# Patient Record
Sex: Male | Born: 1970 | Race: White | Hispanic: No | Smoking: Never smoker
Health system: Southern US, Community
[De-identification: ages and names within clinical notes are randomized; demographics above are authoritative.]

## PROBLEM LIST (undated history)

## (undated) DIAGNOSIS — Z794 Long term (current) use of insulin: Secondary | ICD-10-CM

## (undated) DIAGNOSIS — E119 Type 2 diabetes mellitus without complications: Secondary | ICD-10-CM

## (undated) DIAGNOSIS — I330 Acute and subacute infective endocarditis: Secondary | ICD-10-CM

## (undated) DIAGNOSIS — A491 Streptococcal infection, unspecified site: Secondary | ICD-10-CM

## (undated) DIAGNOSIS — M01X0 Direct infection of unspecified joint in infectious and parasitic diseases classified elsewhere: Secondary | ICD-10-CM

## (undated) DIAGNOSIS — Z87442 Personal history of urinary calculi: Secondary | ICD-10-CM

## (undated) DIAGNOSIS — IMO0001 Reserved for inherently not codable concepts without codable children: Secondary | ICD-10-CM

## (undated) DIAGNOSIS — A429 Actinomycosis, unspecified: Secondary | ICD-10-CM

## (undated) DIAGNOSIS — T79A0XA Compartment syndrome, unspecified, initial encounter: Secondary | ICD-10-CM

## (undated) DIAGNOSIS — M009 Pyogenic arthritis, unspecified: Secondary | ICD-10-CM

## (undated) DIAGNOSIS — B49 Unspecified mycosis: Secondary | ICD-10-CM

## (undated) DIAGNOSIS — F1990 Other psychoactive substance use, unspecified, uncomplicated: Secondary | ICD-10-CM

## (undated) DIAGNOSIS — I358 Other nonrheumatic aortic valve disorders: Secondary | ICD-10-CM

## (undated) DIAGNOSIS — G8929 Other chronic pain: Secondary | ICD-10-CM

## (undated) HISTORY — PX: CHOLECYSTECTOMY: SHX55

## (undated) HISTORY — PX: DECOMPRESSION FASCIOTOMY LEG: SUR403

## (undated) HISTORY — PX: SKIN GRAFT: SHX250

## (undated) HISTORY — PX: SHOULDER SURGERY: SHX246

---

## 2013-03-26 DIAGNOSIS — M51379 Other intervertebral disc degeneration, lumbosacral region without mention of lumbar back pain or lower extremity pain: Secondary | ICD-10-CM | POA: Insufficient documentation

## 2013-04-18 DIAGNOSIS — E1165 Type 2 diabetes mellitus with hyperglycemia: Secondary | ICD-10-CM | POA: Insufficient documentation

## 2013-05-23 DIAGNOSIS — F411 Generalized anxiety disorder: Secondary | ICD-10-CM | POA: Insufficient documentation

## 2015-10-25 DIAGNOSIS — G894 Chronic pain syndrome: Secondary | ICD-10-CM | POA: Diagnosis not present

## 2015-10-25 DIAGNOSIS — F419 Anxiety disorder, unspecified: Secondary | ICD-10-CM | POA: Diagnosis not present

## 2015-10-25 DIAGNOSIS — I10 Essential (primary) hypertension: Secondary | ICD-10-CM | POA: Diagnosis not present

## 2015-10-25 DIAGNOSIS — M129 Arthropathy, unspecified: Secondary | ICD-10-CM | POA: Diagnosis not present

## 2015-10-25 DIAGNOSIS — F1120 Opioid dependence, uncomplicated: Secondary | ICD-10-CM | POA: Diagnosis not present

## 2015-11-28 DIAGNOSIS — G894 Chronic pain syndrome: Secondary | ICD-10-CM | POA: Diagnosis not present

## 2015-11-28 DIAGNOSIS — M1990 Unspecified osteoarthritis, unspecified site: Secondary | ICD-10-CM | POA: Diagnosis not present

## 2015-11-28 DIAGNOSIS — E119 Type 2 diabetes mellitus without complications: Secondary | ICD-10-CM | POA: Diagnosis not present

## 2015-11-28 DIAGNOSIS — I10 Essential (primary) hypertension: Secondary | ICD-10-CM | POA: Diagnosis not present

## 2015-12-13 DIAGNOSIS — M7989 Other specified soft tissue disorders: Secondary | ICD-10-CM | POA: Diagnosis not present

## 2015-12-13 DIAGNOSIS — Y939 Activity, unspecified: Secondary | ICD-10-CM | POA: Diagnosis not present

## 2015-12-13 DIAGNOSIS — W19XXXA Unspecified fall, initial encounter: Secondary | ICD-10-CM | POA: Diagnosis not present

## 2015-12-13 DIAGNOSIS — S5012XA Contusion of left forearm, initial encounter: Secondary | ICD-10-CM | POA: Diagnosis not present

## 2015-12-13 DIAGNOSIS — M25522 Pain in left elbow: Secondary | ICD-10-CM | POA: Diagnosis not present

## 2015-12-14 DIAGNOSIS — M25522 Pain in left elbow: Secondary | ICD-10-CM | POA: Diagnosis not present

## 2015-12-14 DIAGNOSIS — B954 Other streptococcus as the cause of diseases classified elsewhere: Secondary | ICD-10-CM | POA: Diagnosis not present

## 2015-12-14 DIAGNOSIS — I10 Essential (primary) hypertension: Secondary | ICD-10-CM | POA: Diagnosis not present

## 2015-12-14 DIAGNOSIS — E1165 Type 2 diabetes mellitus with hyperglycemia: Secondary | ICD-10-CM | POA: Diagnosis not present

## 2015-12-14 DIAGNOSIS — M1990 Unspecified osteoarthritis, unspecified site: Secondary | ICD-10-CM | POA: Diagnosis not present

## 2015-12-14 DIAGNOSIS — F418 Other specified anxiety disorders: Secondary | ICD-10-CM | POA: Diagnosis not present

## 2015-12-14 DIAGNOSIS — F319 Bipolar disorder, unspecified: Secondary | ICD-10-CM | POA: Diagnosis not present

## 2015-12-14 DIAGNOSIS — F1910 Other psychoactive substance abuse, uncomplicated: Secondary | ICD-10-CM | POA: Diagnosis not present

## 2015-12-14 DIAGNOSIS — Z794 Long term (current) use of insulin: Secondary | ICD-10-CM | POA: Diagnosis not present

## 2015-12-14 DIAGNOSIS — L03114 Cellulitis of left upper limb: Secondary | ICD-10-CM | POA: Diagnosis not present

## 2015-12-14 DIAGNOSIS — M069 Rheumatoid arthritis, unspecified: Secondary | ICD-10-CM | POA: Diagnosis not present

## 2015-12-14 DIAGNOSIS — Z79899 Other long term (current) drug therapy: Secondary | ICD-10-CM | POA: Diagnosis not present

## 2015-12-14 DIAGNOSIS — Z885 Allergy status to narcotic agent status: Secondary | ICD-10-CM | POA: Diagnosis not present

## 2015-12-14 DIAGNOSIS — E118 Type 2 diabetes mellitus with unspecified complications: Secondary | ICD-10-CM | POA: Diagnosis not present

## 2015-12-14 DIAGNOSIS — L02414 Cutaneous abscess of left upper limb: Secondary | ICD-10-CM | POA: Diagnosis not present

## 2015-12-15 DIAGNOSIS — L03114 Cellulitis of left upper limb: Secondary | ICD-10-CM | POA: Diagnosis not present

## 2015-12-15 DIAGNOSIS — E1165 Type 2 diabetes mellitus with hyperglycemia: Secondary | ICD-10-CM | POA: Diagnosis not present

## 2015-12-15 DIAGNOSIS — I10 Essential (primary) hypertension: Secondary | ICD-10-CM | POA: Diagnosis not present

## 2015-12-16 DIAGNOSIS — Z794 Long term (current) use of insulin: Secondary | ICD-10-CM | POA: Diagnosis not present

## 2015-12-16 DIAGNOSIS — E1165 Type 2 diabetes mellitus with hyperglycemia: Secondary | ICD-10-CM | POA: Diagnosis not present

## 2015-12-16 DIAGNOSIS — I10 Essential (primary) hypertension: Secondary | ICD-10-CM | POA: Diagnosis not present

## 2015-12-16 DIAGNOSIS — E119 Type 2 diabetes mellitus without complications: Secondary | ICD-10-CM | POA: Diagnosis not present

## 2015-12-16 DIAGNOSIS — L03114 Cellulitis of left upper limb: Secondary | ICD-10-CM | POA: Diagnosis not present

## 2015-12-16 DIAGNOSIS — L02414 Cutaneous abscess of left upper limb: Secondary | ICD-10-CM | POA: Diagnosis not present

## 2015-12-17 DIAGNOSIS — S59902A Unspecified injury of left elbow, initial encounter: Secondary | ICD-10-CM | POA: Diagnosis not present

## 2015-12-17 DIAGNOSIS — M25562 Pain in left knee: Secondary | ICD-10-CM | POA: Diagnosis not present

## 2015-12-24 DIAGNOSIS — Z87828 Personal history of other (healed) physical injury and trauma: Secondary | ICD-10-CM | POA: Diagnosis not present

## 2015-12-24 DIAGNOSIS — Z9889 Other specified postprocedural states: Secondary | ICD-10-CM | POA: Diagnosis not present

## 2015-12-24 DIAGNOSIS — I10 Essential (primary) hypertension: Secondary | ICD-10-CM | POA: Diagnosis not present

## 2015-12-24 DIAGNOSIS — E119 Type 2 diabetes mellitus without complications: Secondary | ICD-10-CM | POA: Diagnosis not present

## 2015-12-24 DIAGNOSIS — Z4800 Encounter for change or removal of nonsurgical wound dressing: Secondary | ICD-10-CM | POA: Diagnosis not present

## 2015-12-24 DIAGNOSIS — Z79899 Other long term (current) drug therapy: Secondary | ICD-10-CM | POA: Diagnosis not present

## 2015-12-24 DIAGNOSIS — M25522 Pain in left elbow: Secondary | ICD-10-CM | POA: Diagnosis not present

## 2015-12-24 DIAGNOSIS — G8929 Other chronic pain: Secondary | ICD-10-CM | POA: Diagnosis not present

## 2015-12-26 DIAGNOSIS — F419 Anxiety disorder, unspecified: Secondary | ICD-10-CM | POA: Diagnosis not present

## 2015-12-26 DIAGNOSIS — E119 Type 2 diabetes mellitus without complications: Secondary | ICD-10-CM | POA: Diagnosis not present

## 2015-12-26 DIAGNOSIS — F5221 Male erectile disorder: Secondary | ICD-10-CM | POA: Diagnosis not present

## 2015-12-26 DIAGNOSIS — M1380 Other specified arthritis, unspecified site: Secondary | ICD-10-CM | POA: Diagnosis not present

## 2015-12-26 DIAGNOSIS — I11 Hypertensive heart disease: Secondary | ICD-10-CM | POA: Diagnosis not present

## 2015-12-26 DIAGNOSIS — I10 Essential (primary) hypertension: Secondary | ICD-10-CM | POA: Diagnosis not present

## 2015-12-26 DIAGNOSIS — G894 Chronic pain syndrome: Secondary | ICD-10-CM | POA: Diagnosis not present

## 2016-01-24 DIAGNOSIS — F419 Anxiety disorder, unspecified: Secondary | ICD-10-CM | POA: Diagnosis not present

## 2016-01-24 DIAGNOSIS — G629 Polyneuropathy, unspecified: Secondary | ICD-10-CM | POA: Diagnosis not present

## 2016-01-24 DIAGNOSIS — G894 Chronic pain syndrome: Secondary | ICD-10-CM | POA: Diagnosis not present

## 2016-02-25 DIAGNOSIS — G894 Chronic pain syndrome: Secondary | ICD-10-CM | POA: Diagnosis not present

## 2016-03-06 ENCOUNTER — Other Ambulatory Visit: Payer: Self-pay

## 2016-03-06 NOTE — Patient Outreach (Signed)
[facility]) Care Management  03/06/2016  Derek Mitchell 05-24-1971 [id number]  Telephone call to patient regarding health team advantage high risk referral. Unable to reach patient.  HIPAA compliant voice mail message left with call back phone number.    PLAN:  RNCM will attempt 2nd telephone call to patient within 1 week.  George Ina RN,BSN,CCM [facility] Telephonic  [phone]

## 2016-03-10 ENCOUNTER — Ambulatory Visit: Payer: Self-pay

## 2016-03-13 ENCOUNTER — Other Ambulatory Visit: Payer: Self-pay

## 2016-03-13 DIAGNOSIS — E0821 Diabetes mellitus due to underlying condition with diabetic nephropathy: Secondary | ICD-10-CM

## 2016-03-13 NOTE — Patient Outreach (Signed)
[facility]) Care Management  03/13/2016  Derek Mitchell September 26, 1971 [id number]   SUBJECTIVE:  Telephone call to patient regarding health team advantage high risk referral.  HIPAA verified with patient. Discussed and offered [facility] care management services to patient. Patient verbally agreed to telephone follow up only.  DIABETES: Patient states he is having difficulty with his blood sugars. Patient states his last visit with his primary MD was 02/25/16.  Patient states he will be following up with his primary MD in June 2017. Patient states he tried to make a follow up appointment with his primary MD at his last visit. States the office was busy and would call him back to schedule. Patient states he still does not have a follow up appointment for June.   Patient states his last A1c was 11.6. Patient states his blood sugar has reached a level that it would not register on the glucometer and his lowest blood sugar was 280.   Patient states his blood sugars run in the 600's. Patient states he passes out or becomes lethargic when his blood sugar is around 700.  Patient states he is on insulin either levimir or lantas. Patient states he does not take his insulin all of the time. Patient states "he has a problem buying medicine that does not work."  Patient states he checks his blood sugar, "maybe every other day." patient states he does not have a glucometer. Patient states he uses his mothers glucometer.Patient states his doctor talked with him about seeing an endocrinologist. Patient states he has severe neuropathy in his legs. Patient states he has had 13 surgeries on his right leg. States his leg was almost amputated but the doctor was able to save it. Patient states his eyes are already being affected because of his diabetes. t. Patient states he does have some dental issues because of the diabetes as well.   Patient states he has been in the emergency room in the past due to irregular heart  beat.  Patient states he does not take any medication for this.  Patient states he was prescribed medication for his blood pressure.  States he does not take all of the time because his blood pressures are normally good.  Patient states he does have a blood pressure monitor at home and he checks his blood pressure periodically.  RNCM offered to mail patient information regarding dental resources.  Patient verbally agreed to receive information.   ASSESSMENT: Patient poorly controlling diabetes.  Most recent A1c per patient is 11.6. Does not have his own glucometer, uses his mothers.  Per patient he is not checking his blood sugars regularly or taking his diabetic medication regularly.   Patient would benefit from diabetes disease management.   PLAN:  RNCM will refer patient to health coach.   George Ina RN,BSN,CCM [facility] Telephonic  [phone]

## 2016-03-19 DIAGNOSIS — R52 Pain, unspecified: Secondary | ICD-10-CM | POA: Diagnosis not present

## 2016-03-19 DIAGNOSIS — F410 Panic disorder [episodic paroxysmal anxiety] without agoraphobia: Secondary | ICD-10-CM | POA: Diagnosis not present

## 2016-03-24 ENCOUNTER — Other Ambulatory Visit: Payer: Self-pay | Admitting: *Deleted

## 2016-03-24 NOTE — Patient Outreach (Addendum)
[facility]) Care Management  03/24/2016  Derek Mitchell February 09, 1971 [id number]   RN Health Coach telephone call to patient for initial assessment.  Patient referred from [place] [facility] CM. Per patient he does have a PCP Dr Lerry Liner. Patient is on his way out the door and requested a call back.  Plan: RN will  Call back with 14 days  Gean Maidens BSN RN [facility] Care Management [phone]

## 2016-03-26 DIAGNOSIS — F1110 Opioid abuse, uncomplicated: Secondary | ICD-10-CM | POA: Diagnosis not present

## 2016-03-26 DIAGNOSIS — L03114 Cellulitis of left upper limb: Secondary | ICD-10-CM | POA: Diagnosis not present

## 2016-03-26 DIAGNOSIS — I10 Essential (primary) hypertension: Secondary | ICD-10-CM | POA: Diagnosis not present

## 2016-03-26 DIAGNOSIS — Z794 Long term (current) use of insulin: Secondary | ICD-10-CM | POA: Diagnosis not present

## 2016-03-26 DIAGNOSIS — L02414 Cutaneous abscess of left upper limb: Secondary | ICD-10-CM | POA: Diagnosis not present

## 2016-03-26 DIAGNOSIS — G8929 Other chronic pain: Secondary | ICD-10-CM | POA: Diagnosis not present

## 2016-03-26 DIAGNOSIS — L03113 Cellulitis of right upper limb: Secondary | ICD-10-CM | POA: Diagnosis not present

## 2016-03-26 DIAGNOSIS — F319 Bipolar disorder, unspecified: Secondary | ICD-10-CM | POA: Diagnosis not present

## 2016-03-26 DIAGNOSIS — E119 Type 2 diabetes mellitus without complications: Secondary | ICD-10-CM | POA: Diagnosis not present

## 2016-03-26 DIAGNOSIS — F1410 Cocaine abuse, uncomplicated: Secondary | ICD-10-CM | POA: Diagnosis not present

## 2016-03-26 DIAGNOSIS — Z79899 Other long term (current) drug therapy: Secondary | ICD-10-CM | POA: Diagnosis not present

## 2016-03-26 DIAGNOSIS — F1210 Cannabis abuse, uncomplicated: Secondary | ICD-10-CM | POA: Diagnosis not present

## 2016-03-31 ENCOUNTER — Other Ambulatory Visit: Payer: Self-pay | Admitting: *Deleted

## 2016-03-31 DIAGNOSIS — G894 Chronic pain syndrome: Secondary | ICD-10-CM | POA: Diagnosis not present
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
## 2016-04-01 ENCOUNTER — Encounter: Payer: Self-pay | Admitting: *Deleted

## 2016-04-05 DIAGNOSIS — L02413 Cutaneous abscess of right upper limb: Secondary | ICD-10-CM | POA: Diagnosis not present

## 2016-04-29 ENCOUNTER — Other Ambulatory Visit: Payer: Self-pay | Admitting: *Deleted

## 2016-04-29 NOTE — Patient Outreach (Signed)
[facility]) Care Management  04/29/2016  Derek Mitchell [birth year] [id number]   RN Health Coach  attempted  #1 Follow up outreach call to patient.  Patient stated he was at the Dr office now. RN suggested you may be able to get an A1C done. Patient stated maybe I can't talk right now. Plan: RN will call patient again within 14 days.  Gean Maidens BSN RN [facility] Care Management [phone]

## 2016-05-05 ENCOUNTER — Other Ambulatory Visit: Payer: Self-pay | Admitting: *Deleted

## 2016-05-08 NOTE — Patient Outreach (Signed)
[facility]) Care Management  05/08/2016  Derek Mitchell [birth year] [id number]  RN Health Coach  Attempted 2nd outreach call to patient.  Patient was unavailable. No voice mail pickup. Per voice mail  machine the voice mail had not been set up. Plan: RN will call patient again within 14 days.   Gean Maidens BSN RN [facility] Care Management [phone]

## 2016-05-21 ENCOUNTER — Encounter: Payer: Self-pay | Admitting: *Deleted

## 2016-05-21 ENCOUNTER — Other Ambulatory Visit: Payer: Self-pay | Admitting: *Deleted

## 2016-05-21 NOTE — Patient Outreach (Signed)
[facility]) Care Management  05/21/2016  CEDERIC MOZLEY 1971/05/06 [id number]    RN Health Coach  attempted 3rd outreach call to patient.  HIPPA compliance verified. Per patient he has received the information that was sent. He has not read it. He stated that his mother has not been doing good and he has been trying to help her. Patient stated this is not a good time.  Plan: RN will send patient an unsuccessful outreach letter  And notify physician office.In 10 days if patient hasn't reach out will send a closure letter to patient and Dr.  Gean Maidens BSN RN [facility] Care Management [phone]

## 2016-07-01 ENCOUNTER — Encounter: Payer: Self-pay | Admitting: *Deleted

## 2016-08-19 DIAGNOSIS — M545 Low back pain: Secondary | ICD-10-CM | POA: Diagnosis not present

## 2016-08-19 DIAGNOSIS — R51 Headache: Secondary | ICD-10-CM | POA: Diagnosis not present

## 2016-08-19 DIAGNOSIS — H538 Other visual disturbances: Secondary | ICD-10-CM | POA: Diagnosis not present

## 2016-08-19 DIAGNOSIS — M542 Cervicalgia: Secondary | ICD-10-CM | POA: Diagnosis not present

## 2016-09-18 DIAGNOSIS — F410 Panic disorder [episodic paroxysmal anxiety] without agoraphobia: Secondary | ICD-10-CM | POA: Diagnosis not present

## 2017-03-05 DIAGNOSIS — F410 Panic disorder [episodic paroxysmal anxiety] without agoraphobia: Secondary | ICD-10-CM | POA: Diagnosis not present

## 2017-09-15 ENCOUNTER — Other Ambulatory Visit: Payer: Self-pay | Admitting: *Deleted

## 2018-02-05 DIAGNOSIS — L02512 Cutaneous abscess of left hand: Secondary | ICD-10-CM

## 2018-02-05 DIAGNOSIS — E1152 Type 2 diabetes mellitus with diabetic peripheral angiopathy with gangrene: Secondary | ICD-10-CM

## 2018-02-05 DIAGNOSIS — I96 Gangrene, not elsewhere classified: Secondary | ICD-10-CM

## 2018-02-05 DIAGNOSIS — L03114 Cellulitis of left upper limb: Secondary | ICD-10-CM | POA: Diagnosis not present

## 2018-02-05 DIAGNOSIS — R600 Localized edema: Secondary | ICD-10-CM | POA: Diagnosis not present

## 2018-02-05 DIAGNOSIS — I10 Essential (primary) hypertension: Secondary | ICD-10-CM | POA: Diagnosis not present

## 2018-02-05 DIAGNOSIS — G8929 Other chronic pain: Secondary | ICD-10-CM | POA: Diagnosis not present

## 2018-02-06 DIAGNOSIS — L03114 Cellulitis of left upper limb: Secondary | ICD-10-CM | POA: Diagnosis not present

## 2018-02-06 DIAGNOSIS — I96 Gangrene, not elsewhere classified: Secondary | ICD-10-CM | POA: Diagnosis not present

## 2018-02-06 DIAGNOSIS — I10 Essential (primary) hypertension: Secondary | ICD-10-CM | POA: Diagnosis not present

## 2018-02-06 DIAGNOSIS — E1152 Type 2 diabetes mellitus with diabetic peripheral angiopathy with gangrene: Secondary | ICD-10-CM | POA: Diagnosis not present

## 2018-02-06 DIAGNOSIS — G8929 Other chronic pain: Secondary | ICD-10-CM | POA: Diagnosis not present

## 2018-02-06 DIAGNOSIS — L02512 Cutaneous abscess of left hand: Secondary | ICD-10-CM | POA: Diagnosis not present

## 2018-02-07 DIAGNOSIS — L02512 Cutaneous abscess of left hand: Secondary | ICD-10-CM | POA: Diagnosis not present

## 2018-02-07 DIAGNOSIS — L03114 Cellulitis of left upper limb: Secondary | ICD-10-CM | POA: Diagnosis not present

## 2018-02-07 DIAGNOSIS — I330 Acute and subacute infective endocarditis: Secondary | ICD-10-CM

## 2018-02-07 DIAGNOSIS — F1110 Opioid abuse, uncomplicated: Secondary | ICD-10-CM | POA: Diagnosis not present

## 2018-02-07 DIAGNOSIS — I96 Gangrene, not elsewhere classified: Secondary | ICD-10-CM | POA: Diagnosis not present

## 2018-02-07 DIAGNOSIS — K739 Chronic hepatitis, unspecified: Secondary | ICD-10-CM

## 2018-02-07 DIAGNOSIS — E1152 Type 2 diabetes mellitus with diabetic peripheral angiopathy with gangrene: Secondary | ICD-10-CM | POA: Diagnosis not present

## 2018-02-07 DIAGNOSIS — E1165 Type 2 diabetes mellitus with hyperglycemia: Secondary | ICD-10-CM | POA: Diagnosis not present

## 2018-02-07 DIAGNOSIS — B49 Unspecified mycosis: Secondary | ICD-10-CM | POA: Diagnosis not present

## 2018-02-07 DIAGNOSIS — I10 Essential (primary) hypertension: Secondary | ICD-10-CM | POA: Diagnosis not present

## 2018-02-07 DIAGNOSIS — F1990 Other psychoactive substance use, unspecified, uncomplicated: Secondary | ICD-10-CM | POA: Diagnosis not present

## 2018-02-07 DIAGNOSIS — B1920 Unspecified viral hepatitis C without hepatic coma: Secondary | ICD-10-CM | POA: Diagnosis not present

## 2018-02-08 DIAGNOSIS — I330 Acute and subacute infective endocarditis: Secondary | ICD-10-CM | POA: Diagnosis not present

## 2018-02-08 DIAGNOSIS — B49 Unspecified mycosis: Secondary | ICD-10-CM | POA: Diagnosis not present

## 2018-02-08 DIAGNOSIS — K739 Chronic hepatitis, unspecified: Secondary | ICD-10-CM | POA: Diagnosis not present

## 2018-02-08 DIAGNOSIS — B1920 Unspecified viral hepatitis C without hepatic coma: Secondary | ICD-10-CM | POA: Diagnosis not present

## 2018-02-09 DIAGNOSIS — F1110 Opioid abuse, uncomplicated: Secondary | ICD-10-CM | POA: Diagnosis not present

## 2018-02-09 DIAGNOSIS — E1165 Type 2 diabetes mellitus with hyperglycemia: Secondary | ICD-10-CM | POA: Diagnosis not present

## 2018-02-09 DIAGNOSIS — I10 Essential (primary) hypertension: Secondary | ICD-10-CM | POA: Diagnosis not present

## 2018-02-09 DIAGNOSIS — F1990 Other psychoactive substance use, unspecified, uncomplicated: Secondary | ICD-10-CM | POA: Diagnosis not present

## 2018-02-09 DIAGNOSIS — K739 Chronic hepatitis, unspecified: Secondary | ICD-10-CM | POA: Diagnosis not present

## 2018-02-09 DIAGNOSIS — A419 Sepsis, unspecified organism: Secondary | ICD-10-CM | POA: Diagnosis not present

## 2018-02-09 DIAGNOSIS — L03114 Cellulitis of left upper limb: Secondary | ICD-10-CM | POA: Diagnosis not present

## 2018-02-09 DIAGNOSIS — B49 Unspecified mycosis: Secondary | ICD-10-CM | POA: Diagnosis not present

## 2018-02-09 DIAGNOSIS — I330 Acute and subacute infective endocarditis: Secondary | ICD-10-CM | POA: Diagnosis not present

## 2018-02-09 DIAGNOSIS — B1920 Unspecified viral hepatitis C without hepatic coma: Secondary | ICD-10-CM | POA: Diagnosis not present

## 2018-02-09 DIAGNOSIS — L02512 Cutaneous abscess of left hand: Secondary | ICD-10-CM | POA: Diagnosis not present

## 2018-02-10 DIAGNOSIS — I330 Acute and subacute infective endocarditis: Secondary | ICD-10-CM | POA: Diagnosis not present

## 2018-02-10 DIAGNOSIS — K739 Chronic hepatitis, unspecified: Secondary | ICD-10-CM | POA: Diagnosis not present

## 2018-02-10 DIAGNOSIS — B1920 Unspecified viral hepatitis C without hepatic coma: Secondary | ICD-10-CM | POA: Diagnosis not present

## 2018-02-10 DIAGNOSIS — B49 Unspecified mycosis: Secondary | ICD-10-CM | POA: Diagnosis not present

## 2018-02-11 DIAGNOSIS — B49 Unspecified mycosis: Secondary | ICD-10-CM | POA: Diagnosis not present

## 2018-02-11 DIAGNOSIS — B1920 Unspecified viral hepatitis C without hepatic coma: Secondary | ICD-10-CM | POA: Diagnosis not present

## 2018-02-11 DIAGNOSIS — A419 Sepsis, unspecified organism: Secondary | ICD-10-CM | POA: Diagnosis not present

## 2018-02-11 DIAGNOSIS — I330 Acute and subacute infective endocarditis: Secondary | ICD-10-CM | POA: Diagnosis not present

## 2018-02-11 DIAGNOSIS — K739 Chronic hepatitis, unspecified: Secondary | ICD-10-CM | POA: Diagnosis not present

## 2018-02-12 ENCOUNTER — Inpatient Hospital Stay (HOSPITAL_COMMUNITY)
Admission: AD | Admit: 2018-02-12 | Discharge: 2018-02-20 | DRG: 289 | Disposition: A | Discharge status: Home / Self Care | Payer: Medicare HMO | Source: Other Acute Inpatient Hospital | Attending: Internal Medicine | Admitting: Internal Medicine

## 2018-02-12 DIAGNOSIS — M00862 Arthritis due to other bacteria, left knee: Secondary | ICD-10-CM | POA: Diagnosis present

## 2018-02-12 DIAGNOSIS — I5030 Unspecified diastolic (congestive) heart failure: Secondary | ICD-10-CM | POA: Diagnosis not present

## 2018-02-12 DIAGNOSIS — E119 Type 2 diabetes mellitus without complications: Secondary | ICD-10-CM

## 2018-02-12 DIAGNOSIS — K029 Dental caries, unspecified: Secondary | ICD-10-CM | POA: Diagnosis not present

## 2018-02-12 DIAGNOSIS — B1920 Unspecified viral hepatitis C without hepatic coma: Secondary | ICD-10-CM | POA: Diagnosis not present

## 2018-02-12 DIAGNOSIS — B954 Other streptococcus as the cause of diseases classified elsewhere: Secondary | ICD-10-CM | POA: Diagnosis not present

## 2018-02-12 DIAGNOSIS — F1990 Other psychoactive substance use, unspecified, uncomplicated: Secondary | ICD-10-CM | POA: Diagnosis present

## 2018-02-12 DIAGNOSIS — Z794 Long term (current) use of insulin: Secondary | ICD-10-CM | POA: Diagnosis not present

## 2018-02-12 DIAGNOSIS — E1142 Type 2 diabetes mellitus with diabetic polyneuropathy: Secondary | ICD-10-CM | POA: Diagnosis present

## 2018-02-12 DIAGNOSIS — Z9119 Patient's noncompliance with other medical treatment and regimen: Secondary | ICD-10-CM

## 2018-02-12 DIAGNOSIS — K739 Chronic hepatitis, unspecified: Secondary | ICD-10-CM | POA: Diagnosis not present

## 2018-02-12 DIAGNOSIS — K219 Gastro-esophageal reflux disease without esophagitis: Secondary | ICD-10-CM | POA: Diagnosis present

## 2018-02-12 DIAGNOSIS — A429 Actinomycosis, unspecified: Secondary | ICD-10-CM | POA: Diagnosis not present

## 2018-02-12 DIAGNOSIS — L02512 Cutaneous abscess of left hand: Secondary | ICD-10-CM | POA: Diagnosis present

## 2018-02-12 DIAGNOSIS — F1410 Cocaine abuse, uncomplicated: Secondary | ICD-10-CM | POA: Diagnosis present

## 2018-02-12 DIAGNOSIS — E1165 Type 2 diabetes mellitus with hyperglycemia: Secondary | ICD-10-CM | POA: Diagnosis not present

## 2018-02-12 DIAGNOSIS — A491 Streptococcal infection, unspecified site: Secondary | ICD-10-CM | POA: Diagnosis present

## 2018-02-12 DIAGNOSIS — M00242 Other streptococcal arthritis, left hand: Secondary | ICD-10-CM | POA: Diagnosis not present

## 2018-02-12 DIAGNOSIS — L02519 Cutaneous abscess of unspecified hand: Secondary | ICD-10-CM | POA: Diagnosis not present

## 2018-02-12 DIAGNOSIS — I080 Rheumatic disorders of both mitral and aortic valves: Secondary | ICD-10-CM | POA: Diagnosis present

## 2018-02-12 DIAGNOSIS — B49 Unspecified mycosis: Secondary | ICD-10-CM | POA: Diagnosis present

## 2018-02-12 DIAGNOSIS — B9689 Other specified bacterial agents as the cause of diseases classified elsewhere: Secondary | ICD-10-CM | POA: Diagnosis present

## 2018-02-12 DIAGNOSIS — M79652 Pain in left thigh: Secondary | ICD-10-CM | POA: Diagnosis not present

## 2018-02-12 DIAGNOSIS — F329 Major depressive disorder, single episode, unspecified: Secondary | ICD-10-CM | POA: Diagnosis not present

## 2018-02-12 DIAGNOSIS — F419 Anxiety disorder, unspecified: Secondary | ICD-10-CM | POA: Diagnosis not present

## 2018-02-12 DIAGNOSIS — R7881 Bacteremia: Secondary | ICD-10-CM | POA: Diagnosis present

## 2018-02-12 DIAGNOSIS — I339 Acute and subacute endocarditis, unspecified: Secondary | ICD-10-CM | POA: Diagnosis not present

## 2018-02-12 DIAGNOSIS — I358 Other nonrheumatic aortic valve disorders: Secondary | ICD-10-CM | POA: Diagnosis not present

## 2018-02-12 DIAGNOSIS — F1110 Opioid abuse, uncomplicated: Secondary | ICD-10-CM | POA: Diagnosis not present

## 2018-02-12 DIAGNOSIS — IMO0001 Reserved for inherently not codable concepts without codable children: Secondary | ICD-10-CM

## 2018-02-12 DIAGNOSIS — I38 Endocarditis, valve unspecified: Secondary | ICD-10-CM | POA: Diagnosis present

## 2018-02-12 DIAGNOSIS — G8921 Chronic pain due to trauma: Secondary | ICD-10-CM | POA: Diagnosis not present

## 2018-02-12 DIAGNOSIS — M009 Pyogenic arthritis, unspecified: Secondary | ICD-10-CM | POA: Diagnosis present

## 2018-02-12 DIAGNOSIS — G8929 Other chronic pain: Secondary | ICD-10-CM | POA: Diagnosis present

## 2018-02-12 DIAGNOSIS — B376 Candidal endocarditis: Principal | ICD-10-CM | POA: Diagnosis present

## 2018-02-12 DIAGNOSIS — F1190 Opioid use, unspecified, uncomplicated: Secondary | ICD-10-CM | POA: Diagnosis not present

## 2018-02-12 DIAGNOSIS — Z79899 Other long term (current) drug therapy: Secondary | ICD-10-CM | POA: Diagnosis not present

## 2018-02-12 DIAGNOSIS — I10 Essential (primary) hypertension: Secondary | ICD-10-CM | POA: Diagnosis not present

## 2018-02-12 DIAGNOSIS — I330 Acute and subacute infective endocarditis: Secondary | ICD-10-CM | POA: Diagnosis not present

## 2018-02-12 DIAGNOSIS — M01X0 Direct infection of unspecified joint in infectious and parasitic diseases classified elsewhere: Secondary | ICD-10-CM

## 2018-02-12 DIAGNOSIS — F1490 Cocaine use, unspecified, uncomplicated: Secondary | ICD-10-CM | POA: Diagnosis not present

## 2018-02-12 DIAGNOSIS — K0889 Other specified disorders of teeth and supporting structures: Secondary | ICD-10-CM | POA: Diagnosis not present

## 2018-02-12 DIAGNOSIS — A4289 Other forms of actinomycosis: Secondary | ICD-10-CM | POA: Diagnosis not present

## 2018-02-12 DIAGNOSIS — L03114 Cellulitis of left upper limb: Secondary | ICD-10-CM | POA: Diagnosis not present

## 2018-02-12 HISTORY — DX: Acute and subacute infective endocarditis: I33.0

## 2018-02-12 HISTORY — DX: Reserved for inherently not codable concepts without codable children: IMO0001

## 2018-02-12 HISTORY — DX: Streptococcal infection, unspecified site: A49.1

## 2018-02-12 HISTORY — DX: Direct infection of unspecified joint in infectious and parasitic diseases classified elsewhere: M01.X0

## 2018-02-12 HISTORY — DX: Pyogenic arthritis, unspecified: M00.9

## 2018-02-12 HISTORY — DX: Type 2 diabetes mellitus without complications: E11.9

## 2018-02-12 HISTORY — DX: Personal history of urinary calculi: Z87.442

## 2018-02-12 HISTORY — DX: Actinomycosis, unspecified: A42.9

## 2018-02-12 HISTORY — DX: Unspecified mycosis: B49

## 2018-02-12 HISTORY — DX: Other chronic pain: G89.29

## 2018-02-12 HISTORY — DX: Other psychoactive substance use, unspecified, uncomplicated: F19.90

## 2018-02-12 HISTORY — DX: Compartment syndrome, unspecified, initial encounter: T79.A0XA

## 2018-02-12 HISTORY — DX: Long term (current) use of insulin: Z79.4

## 2018-02-12 HISTORY — DX: Other nonrheumatic aortic valve disorders: I35.8

## 2018-02-12 LAB — GLUCOSE, CAPILLARY: GLUCOSE-CAPILLARY: 297 mg/dL — AB (ref 65–99)

## 2018-02-12 MED ORDER — PHENOL 1.4 % MT LIQD
1.0000 | OROMUCOSAL | Status: DC | PRN
  Filled 2018-02-12: qty 177

## 2018-02-12 MED ORDER — PROMETHAZINE HCL 25 MG/ML IJ SOLN: 12.5000 mg | Freq: Four times a day (QID) | INTRAMUSCULAR | Status: DC | PRN

## 2018-02-12 MED ORDER — ACETAMINOPHEN 650 MG RE SUPP: 650.0000 mg | Freq: Four times a day (QID) | RECTAL | Status: DC | PRN

## 2018-02-12 MED ORDER — FLEET ENEMA 7-19 GM/118ML RE ENEM: 1.0000 | ENEMA | RECTAL | Status: DC | PRN

## 2018-02-12 MED ORDER — SODIUM CHLORIDE 0.9 % IV SOLN
100.0000 mg | Freq: Every day | INTRAVENOUS | Status: DC
  Administered 2018-02-13 – 2018-02-14 (×3): 100 mg via INTRAVENOUS
  Filled 2018-02-12 (×3): qty 100

## 2018-02-12 MED ORDER — NALOXONE HCL 0.4 MG/ML IJ SOLN: 0.4000 mg | INTRAMUSCULAR | Status: DC | PRN

## 2018-02-12 MED ORDER — PENICILLIN G POTASSIUM 5000000 UNITS IJ SOLR
4.0000 10*6.[IU] | INTRAVENOUS | Status: DC
  Administered 2018-02-13 – 2018-02-20 (×45): 4 10*6.[IU] via INTRAVENOUS
  Filled 2018-02-12 (×51): qty 4

## 2018-02-12 MED ORDER — ENOXAPARIN SODIUM 40 MG/0.4ML ~~LOC~~ SOLN
40.0000 mg | SUBCUTANEOUS | Status: DC
  Administered 2018-02-13 – 2018-02-20 (×7): 40 mg via SUBCUTANEOUS
  Filled 2018-02-12 (×8): qty 0.4

## 2018-02-12 MED ORDER — INSULIN ASPART 100 UNIT/ML ~~LOC~~ SOLN
0.0000 [IU] | Freq: Three times a day (TID) | SUBCUTANEOUS | Status: DC
  Administered 2018-02-13: 8 [IU] via SUBCUTANEOUS
  Administered 2018-02-13: 11 [IU] via SUBCUTANEOUS

## 2018-02-12 MED ORDER — ACETAMINOPHEN 325 MG PO TABS
650.0000 mg | ORAL_TABLET | Freq: Four times a day (QID) | ORAL | Status: DC | PRN
  Administered 2018-02-13 – 2018-02-17 (×2): 650 mg via ORAL
  Filled 2018-02-12 (×2): qty 2

## 2018-02-12 MED ORDER — PENICILLIN G POTASSIUM 5000000 UNITS IJ SOLR
4.0000 10*6.[IU] | INTRAMUSCULAR | Status: DC
  Filled 2018-02-12 (×3): qty 5

## 2018-02-12 MED ORDER — LIP MEDEX EX OINT
1.0000 "application " | TOPICAL_OINTMENT | CUTANEOUS | Status: DC | PRN
  Filled 2018-02-12: qty 7

## 2018-02-12 MED ORDER — POLYVINYL ALCOHOL 1.4 % OP SOLN
1.0000 [drp] | OPHTHALMIC | Status: DC | PRN
  Filled 2018-02-12: qty 15

## 2018-02-12 MED ORDER — ONDANSETRON HCL 4 MG/2ML IJ SOLN: 4.0000 mg | Freq: Four times a day (QID) | INTRAMUSCULAR | Status: DC | PRN

## 2018-02-12 MED ORDER — INSULIN DETEMIR 100 UNIT/ML ~~LOC~~ SOLN
26.0000 [IU] | Freq: Every day | SUBCUTANEOUS | Status: DC
  Administered 2018-02-13 – 2018-02-15 (×4): 26 [IU] via SUBCUTANEOUS
  Filled 2018-02-12 (×4): qty 0.26

## 2018-02-12 MED ORDER — ONDANSETRON HCL 4 MG PO TABS: 4.0000 mg | ORAL_TABLET | Freq: Four times a day (QID) | ORAL | Status: DC | PRN

## 2018-02-12 MED ORDER — HYDROMORPHONE HCL 1 MG/ML IJ SOLN
1.0000 mg | INTRAMUSCULAR | Status: AC
  Administered 2018-02-12: 1 mg via INTRAVENOUS
  Filled 2018-02-12: qty 1

## 2018-02-12 MED ORDER — HYDROMORPHONE HCL 1 MG/ML IJ SOLN
1.0000 mg | INTRAMUSCULAR | Status: DC | PRN
  Administered 2018-02-13 (×4): 1 mg via INTRAVENOUS
  Filled 2018-02-12 (×4): qty 1

## 2018-02-12 MED ORDER — KETOROLAC TROMETHAMINE 30 MG/ML IJ SOLN
30.0000 mg | Freq: Three times a day (TID) | INTRAMUSCULAR | Status: DC | PRN
  Administered 2018-02-13 – 2018-02-14 (×4): 30 mg via INTRAVENOUS
  Filled 2018-02-12 (×4): qty 1

## 2018-02-12 MED ORDER — SENNOSIDES-DOCUSATE SODIUM 8.6-50 MG PO TABS
1.0000 | ORAL_TABLET | Freq: Two times a day (BID) | ORAL | Status: DC
  Administered 2018-02-13 – 2018-02-20 (×15): 1 via ORAL
  Filled 2018-02-12 (×15): qty 1

## 2018-02-12 MED ORDER — DIPHENHYDRAMINE HCL 25 MG PO CAPS
25.0000 mg | ORAL_CAPSULE | Freq: Four times a day (QID) | ORAL | Status: DC | PRN
  Administered 2018-02-19: 25 mg via ORAL
  Filled 2018-02-12: qty 1

## 2018-02-12 MED ORDER — GABAPENTIN 100 MG PO CAPS
200.0000 mg | ORAL_CAPSULE | Freq: Two times a day (BID) | ORAL | Status: DC
  Administered 2018-02-13 – 2018-02-20 (×16): 200 mg via ORAL
  Filled 2018-02-12 (×16): qty 2

## 2018-02-12 MED ORDER — OXYCODONE HCL 5 MG PO TABS
15.0000 mg | ORAL_TABLET | Freq: Four times a day (QID) | ORAL | Status: DC | PRN
  Administered 2018-02-13 – 2018-02-14 (×5): 15 mg via ORAL
  Filled 2018-02-12 (×5): qty 3
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
## 2018-02-13 ENCOUNTER — Encounter (HOSPITAL_COMMUNITY): Payer: Self-pay | Admitting: Internal Medicine

## 2018-02-13 ENCOUNTER — Other Ambulatory Visit: Payer: Self-pay

## 2018-02-13 DIAGNOSIS — I358 Other nonrheumatic aortic valve disorders: Secondary | ICD-10-CM

## 2018-02-13 DIAGNOSIS — IMO0001 Reserved for inherently not codable concepts without codable children: Secondary | ICD-10-CM

## 2018-02-13 DIAGNOSIS — F329 Major depressive disorder, single episode, unspecified: Secondary | ICD-10-CM

## 2018-02-13 DIAGNOSIS — K0889 Other specified disorders of teeth and supporting structures: Secondary | ICD-10-CM

## 2018-02-13 DIAGNOSIS — A4289 Other forms of actinomycosis: Secondary | ICD-10-CM

## 2018-02-13 DIAGNOSIS — Z794 Long term (current) use of insulin: Secondary | ICD-10-CM

## 2018-02-13 DIAGNOSIS — F419 Anxiety disorder, unspecified: Secondary | ICD-10-CM

## 2018-02-13 DIAGNOSIS — M009 Pyogenic arthritis, unspecified: Secondary | ICD-10-CM

## 2018-02-13 DIAGNOSIS — K029 Dental caries, unspecified: Secondary | ICD-10-CM

## 2018-02-13 DIAGNOSIS — I330 Acute and subacute infective endocarditis: Secondary | ICD-10-CM

## 2018-02-13 DIAGNOSIS — Z885 Allergy status to narcotic agent status: Secondary | ICD-10-CM

## 2018-02-13 DIAGNOSIS — Z91048 Other nonmedicinal substance allergy status: Secondary | ICD-10-CM

## 2018-02-13 DIAGNOSIS — A429 Actinomycosis, unspecified: Secondary | ICD-10-CM | POA: Diagnosis present

## 2018-02-13 DIAGNOSIS — F1990 Other psychoactive substance use, unspecified, uncomplicated: Secondary | ICD-10-CM | POA: Diagnosis present

## 2018-02-13 DIAGNOSIS — M01X0 Direct infection of unspecified joint in infectious and parasitic diseases classified elsewhere: Secondary | ICD-10-CM

## 2018-02-13 DIAGNOSIS — G8921 Chronic pain due to trauma: Secondary | ICD-10-CM

## 2018-02-13 DIAGNOSIS — F1190 Opioid use, unspecified, uncomplicated: Secondary | ICD-10-CM

## 2018-02-13 DIAGNOSIS — I38 Endocarditis, valve unspecified: Secondary | ICD-10-CM | POA: Diagnosis present

## 2018-02-13 DIAGNOSIS — B954 Other streptococcus as the cause of diseases classified elsewhere: Secondary | ICD-10-CM

## 2018-02-13 DIAGNOSIS — F1490 Cocaine use, unspecified, uncomplicated: Secondary | ICD-10-CM

## 2018-02-13 DIAGNOSIS — L02512 Cutaneous abscess of left hand: Secondary | ICD-10-CM | POA: Diagnosis present

## 2018-02-13 DIAGNOSIS — E119 Type 2 diabetes mellitus without complications: Secondary | ICD-10-CM

## 2018-02-13 DIAGNOSIS — B376 Candidal endocarditis: Principal | ICD-10-CM

## 2018-02-13 DIAGNOSIS — A491 Streptococcal infection, unspecified site: Secondary | ICD-10-CM

## 2018-02-13 DIAGNOSIS — B49 Unspecified mycosis: Secondary | ICD-10-CM

## 2018-02-13 DIAGNOSIS — G8929 Other chronic pain: Secondary | ICD-10-CM | POA: Diagnosis present

## 2018-02-13 DIAGNOSIS — L02519 Cutaneous abscess of unspecified hand: Secondary | ICD-10-CM

## 2018-02-13 HISTORY — DX: Unspecified mycosis: B49

## 2018-02-13 HISTORY — DX: Pyogenic arthritis, unspecified: M00.9

## 2018-02-13 HISTORY — DX: Acute and subacute infective endocarditis: I33.0

## 2018-02-13 HISTORY — DX: Streptococcal infection, unspecified site: A49.1

## 2018-02-13 HISTORY — DX: Actinomycosis, unspecified: A42.9

## 2018-02-13 HISTORY — DX: Other nonrheumatic aortic valve disorders: I35.8

## 2018-02-13 LAB — CBC
HEMATOCRIT: 33.7 % — AB (ref 39.0–52.0)
HEMOGLOBIN: 11.1 g/dL — AB (ref 13.0–17.0)
MCH: 28.5 pg (ref 26.0–34.0)
MCHC: 32.9 g/dL (ref 30.0–36.0)
MCV: 86.6 fL (ref 78.0–100.0)
Platelets: 212 10*3/uL (ref 150–400)
RBC: 3.89 MIL/uL — AB (ref 4.22–5.81)
RDW: 13.6 % (ref 11.5–15.5)
WBC: 8.6 10*3/uL (ref 4.0–10.5)

## 2018-02-13 LAB — COMPREHENSIVE METABOLIC PANEL
ALBUMIN: 2.9 g/dL — AB (ref 3.5–5.0)
ALK PHOS: 114 U/L (ref 38–126)
ALT: 58 U/L (ref 17–63)
ANION GAP: 7 (ref 5–15)
AST: 59 U/L — AB (ref 15–41)
BILIRUBIN TOTAL: 0.7 mg/dL (ref 0.3–1.2)
BUN: 8 mg/dL (ref 6–20)
CALCIUM: 8.6 mg/dL — AB (ref 8.9–10.3)
CO2: 29 mmol/L (ref 22–32)
CREATININE: 0.67 mg/dL (ref 0.61–1.24)
Chloride: 96 mmol/L — ABNORMAL LOW (ref 101–111)
GFR calc Af Amer: >60 mL/min (ref >60)
GFR calc non Af Amer: >60 mL/min (ref >60)
GLUCOSE: 273 mg/dL — AB (ref 65–99)
Potassium: 4 mmol/L (ref 3.5–5.1)
SODIUM: 132 mmol/L — AB (ref 135–145)
TOTAL PROTEIN: 6.7 g/dL (ref 6.5–8.1)

## 2018-02-13 LAB — GLUCOSE, CAPILLARY
GLUCOSE-CAPILLARY: 297 mg/dL — AB (ref 65–99)
Glucose-Capillary: 267 mg/dL — ABNORMAL HIGH (ref 65–99)
Glucose-Capillary: 303 mg/dL — ABNORMAL HIGH (ref 65–99)
Glucose-Capillary: 256 mg/dL — ABNORMAL HIGH (ref 65–99)

## 2018-02-13 MED ORDER — SODIUM CHLORIDE 0.9% FLUSH: 10.0000 mL | INTRAVENOUS | Status: DC | PRN

## 2018-02-13 MED ORDER — INSULIN ASPART 100 UNIT/ML ~~LOC~~ SOLN
0.0000 [IU] | Freq: Three times a day (TID) | SUBCUTANEOUS | Status: DC
  Administered 2018-02-13: 3 [IU] via SUBCUTANEOUS
  Administered 2018-02-13: 11 [IU] via SUBCUTANEOUS
  Administered 2018-02-14: 7 [IU] via SUBCUTANEOUS
  Administered 2018-02-14: 15 [IU] via SUBCUTANEOUS
  Administered 2018-02-14: 7 [IU] via SUBCUTANEOUS
  Administered 2018-02-15 (×2): 15 [IU] via SUBCUTANEOUS
  Administered 2018-02-16: 7 [IU] via SUBCUTANEOUS
  Administered 2018-02-16: 15 [IU] via SUBCUTANEOUS

## 2018-02-13 MED ORDER — INSULIN ASPART 100 UNIT/ML ~~LOC~~ SOLN
0.0000 [IU] | Freq: Every day | SUBCUTANEOUS | Status: DC
  Administered 2018-02-13: 3 [IU] via SUBCUTANEOUS
  Administered 2018-02-14: 4 [IU] via SUBCUTANEOUS
  Administered 2018-02-15: 3 [IU] via SUBCUTANEOUS
  Administered 2018-02-16: 4 [IU] via SUBCUTANEOUS
  Administered 2018-02-17: 2 [IU] via SUBCUTANEOUS
  Administered 2018-02-18: 3 [IU] via SUBCUTANEOUS
  Administered 2018-02-19: 4 [IU] via SUBCUTANEOUS

## 2018-02-13 MED ORDER — PANTOPRAZOLE SODIUM 40 MG PO TBEC
40.0000 mg | DELAYED_RELEASE_TABLET | Freq: Every day | ORAL | Status: DC
  Administered 2018-02-13 – 2018-02-20 (×8): 40 mg via ORAL
  Filled 2018-02-13 (×8): qty 1

## 2018-02-13 MED ORDER — HYDROMORPHONE HCL 1 MG/ML IJ SOLN
1.0000 mg | INTRAMUSCULAR | Status: DC | PRN
  Administered 2018-02-13: 2 mg via INTRAVENOUS
  Administered 2018-02-13 (×2): 1 mg via INTRAVENOUS
  Administered 2018-02-13 – 2018-02-15 (×11): 2 mg via INTRAVENOUS
  Administered 2018-02-15: 1 mg via INTRAVENOUS
  Administered 2018-02-15 – 2018-02-20 (×37): 2 mg via INTRAVENOUS
  Filled 2018-02-13 (×16): qty 2
  Filled 2018-02-13: qty 1
  Filled 2018-02-13 (×10): qty 2
  Filled 2018-02-13: qty 1
  Filled 2018-02-13 (×17): qty 2
  Filled 2018-02-13: qty 1
  Filled 2018-02-13 (×6): qty 2
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
## 2018-02-13 NOTE — Progress Notes (Signed)
Pt yelling and aggravated. Pt states he is having cold sweats and 10/10 pain in left knee. Pt demanding to speak to MD. Pt aware of recently received PRN pain medications. MD returned call stated she is not changing medications at this time. Pt educated of rights including leaving AMA. Pt screaming at RN. Pt does not want to leave AMA at this time. Pt states do not come in room until 1700 with IV dilaudid.

## 2018-02-13 NOTE — Progress Notes (Signed)
RN applied dressing to pt's hand per MD verbal order. RN applied gauze/kerlix to pt's knee per MD. Secretary ordered ace wrap to be applied to pt's knee when it arrives.
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
## 2018-02-13 NOTE — Progress Notes (Signed)
I have already increased patient's pain medications once today.  He has 3 different options but despite looking comfortable at rest describes his pain as a 10/10.  No tachycardia or other signs of pain.  Patient has a long history of drug abuse and leaving AMA.  He is aware if he leaves that his infection/valve issue could worsen.

## 2018-02-13 NOTE — Progress Notes (Signed)
Paged MD regarding phelbotomy had 4 unsuccessful attempts at drawing blood cultures  Paged MD to inform  Phlebotomy stated PICC is option Awaiting MD call back

## 2018-02-13 NOTE — Progress Notes (Signed)
Patient is complaining of 101/10 despite having pain medicine given. Patient seems to be resting in bed despite the 10/10 pain level in Left leg. Will continue to monitor.   Elsie Lincoln, RN

## 2018-02-13 NOTE — Progress Notes (Signed)
RN notified MRI that IV team has to disconnect IV before pt can be taken. RN will call MRI when pt is ready.
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
## 2018-02-13 NOTE — Progress Notes (Signed)
RN gave pt a heating pack and an ice pack to help alleviate his knee pain. Pt states he feels like this combo helps with his pain.

## 2018-02-13 NOTE — Clinical Social Work Note (Signed)
Clinical Social Work Assessment  Patient Details  Name: Derek Mitchell MRN: [id number] Date of Birth: [birth year]  Date of referral:  02/13/18               Reason for consult:  Substance Use/ETOH Abuse                Permission sought to share information with:    Permission granted to share information::  No  Name::        Agency::     Relationship::     Contact Information:     Housing/Transportation Living arrangements for the past 2 months:  Apartment Source of Information:  Patient, Medical Team Patient Interpreter Needed:  None Criminal Activity/Legal Involvement Pertinent to Current Situation/Hospitalization:  No - Comment as needed Significant Relationships:  None Lives with:  Self Do you feel safe going back to the place where you live?  Yes Need for family participation in patient care:  No (Coment)  Care giving concerns:  Substance abuse.   Social Worker assessment / plan:  CSW met with patient. No supports at bedside. CSW introduced role and inquired about interest in substance abuse resources. Patient stated he might be interested but was too focused on getting his pain under control. He is agreeable to CSW leaving resources in room. CSW explained it was a sheet with substance abuse treatment facilities within 15 miles of his home. Patient expressed understanding. No further concerns. CSW signing off as social work intervention is no longer needed.  Employment status:  Other (Comment)(Unknown) Insurance information:  Managed Medicare PT Recommendations:  Not assessed at this time Information / Referral to community resources:  Residential Substance Abuse Treatment Options, Outpatient Substance Abuse Treatment Options  Patient/Family's Response to care:  Patient agreeable to receiving resources. Patient's supports unknown. Patient too focused on his pain to know response to care.  Patient/Family's Understanding of and Emotional Response to Diagnosis, Current  Treatment, and Prognosis:  Patient has a good understanding of the reason for admission and social work consult. Patient too focused on pain to know emotional response to treatment.  Emotional Assessment Appearance:  Appears stated age Attitude/Demeanor/Rapport:  Other(Agitated due to pain.) Affect (typically observed):  Agitated, Irritable, Frustrated Orientation:  Oriented to Self, Oriented to Place, Oriented to  Time, Oriented to Situation Alcohol / Substance use:  Illicit Drugs Psych involvement (Current and /or in the community):  No (Comment)  Discharge Needs  Concerns to be addressed:  Care Coordination Readmission within the last 30 days:  No Current discharge risk:  Substance Abuse Barriers to Discharge:  Continued Medical Work up   Candie Chroman, LCSW 02/13/2018, 10:40 AM

## 2018-02-13 NOTE — Progress Notes (Signed)
RN applied ace wrap to pt's left knee per MD verbal order.

## 2018-02-14 ENCOUNTER — Inpatient Hospital Stay (HOSPITAL_COMMUNITY): Payer: Medicare HMO

## 2018-02-14 ENCOUNTER — Encounter (HOSPITAL_COMMUNITY): Payer: Self-pay | Admitting: Radiology

## 2018-02-14 DIAGNOSIS — M79652 Pain in left thigh: Secondary | ICD-10-CM

## 2018-02-14 DIAGNOSIS — I358 Other nonrheumatic aortic valve disorders: Secondary | ICD-10-CM

## 2018-02-14 DIAGNOSIS — A491 Streptococcal infection, unspecified site: Secondary | ICD-10-CM

## 2018-02-14 DIAGNOSIS — I339 Acute and subacute endocarditis, unspecified: Secondary | ICD-10-CM

## 2018-02-14 DIAGNOSIS — A429 Actinomycosis, unspecified: Secondary | ICD-10-CM

## 2018-02-14 LAB — BASIC METABOLIC PANEL
Anion gap: 6 (ref 5–15)
CHLORIDE: 100 mmol/L — AB (ref 101–111)
CO2: 29 mmol/L (ref 22–32)
Calcium: 9 mg/dL (ref 8.9–10.3)
Creatinine, Ser: 0.67 mg/dL (ref 0.61–1.24)
GFR calc Af Amer: >60 mL/min (ref >60)
GFR calc non Af Amer: >60 mL/min (ref >60)
Glucose, Bld: 237 mg/dL — ABNORMAL HIGH (ref 65–99)
Potassium: 4 mmol/L (ref 3.5–5.1)
SODIUM: 135 mmol/L (ref 135–145)

## 2018-02-14 LAB — CBC
HEMATOCRIT: 33.6 % — AB (ref 39.0–52.0)
HEMOGLOBIN: 11.5 g/dL — AB (ref 13.0–17.0)
MCH: 29.6 pg (ref 26.0–34.0)
MCHC: 34.2 g/dL (ref 30.0–36.0)
MCV: 86.4 fL (ref 78.0–100.0)
Platelets: 229 10*3/uL (ref 150–400)
RBC: 3.89 MIL/uL — AB (ref 4.22–5.81)
RDW: 13.6 % (ref 11.5–15.5)
WBC: 10.1 10*3/uL (ref 4.0–10.5)

## 2018-02-14 LAB — GLUCOSE, CAPILLARY
GLUCOSE-CAPILLARY: 249 mg/dL — AB (ref 65–99)
GLUCOSE-CAPILLARY: 217 mg/dL — AB (ref 65–99)
Glucose-Capillary: 303 mg/dL — ABNORMAL HIGH (ref 65–99)
Glucose-Capillary: 324 mg/dL — ABNORMAL HIGH (ref 65–99)

## 2018-02-14 MED ORDER — GADOBENATE DIMEGLUMINE 529 MG/ML IV SOLN
17.0000 mL | Freq: Once | INTRAVENOUS | Status: AC
  Administered 2018-02-14: 17 mL via INTRAVENOUS

## 2018-02-14 MED ORDER — LISINOPRIL 5 MG PO TABS
5.0000 mg | ORAL_TABLET | Freq: Every day | ORAL | Status: DC
  Filled 2018-02-14 (×5): qty 1

## 2018-02-14 MED ORDER — IOPAMIDOL (ISOVUE-300) INJECTION 61%
INTRAVENOUS | Status: AC
  Filled 2018-02-14: qty 100

## 2018-02-14 MED ORDER — INSULIN ASPART 100 UNIT/ML ~~LOC~~ SOLN
3.0000 [IU] | Freq: Three times a day (TID) | SUBCUTANEOUS | Status: DC
  Administered 2018-02-14 – 2018-02-16 (×5): 3 [IU] via SUBCUTANEOUS

## 2018-02-14 MED ORDER — OXYCODONE HCL 5 MG PO TABS
15.0000 mg | ORAL_TABLET | ORAL | Status: DC | PRN
  Administered 2018-02-14 – 2018-02-20 (×28): 15 mg via ORAL
  Filled 2018-02-14 (×29): qty 3

## 2018-02-14 MED ORDER — KETOROLAC TROMETHAMINE 30 MG/ML IJ SOLN
30.0000 mg | Freq: Four times a day (QID) | INTRAMUSCULAR | Status: AC | PRN
  Administered 2018-02-14 – 2018-02-15 (×5): 30 mg via INTRAVENOUS
  Filled 2018-02-14 (×5): qty 1

## 2018-02-14 MED ORDER — IOPAMIDOL (ISOVUE-300) INJECTION 61%
75.0000 mL | Freq: Once | INTRAVENOUS | Status: AC | PRN
  Administered 2018-02-14: 75 mL via INTRAVENOUS
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
## 2018-02-14 NOTE — Progress Notes (Signed)
Subjective: Patient now with left thigh pain  Antibiotics:  Anti-infectives (From admission, onward)   Start     Dose/Rate Route Frequency Ordered Stop   02/13/18 0000  anidulafungin (ERAXIS) 100 mg in sodium chloride 0.9 % 100 mL IVPB     100 mg 78 mL/hr over 100 Minutes Intravenous Daily at bedtime 02/12/18 2306     02/13/18 0000  penicillin G potassium injection 4 Million Units  Status:  Discontinued     4 Million Units Intramuscular Every 4 hours 02/12/18 2306 02/12/18 2351   02/13/18 0000  penicillin G potassium 4 Million Units in dextrose 5 % 250 mL IVPB     4 Million Units 250 mL/hr over 60 Minutes Intravenous Every 4 hours 02/12/18 2351        Medications: Scheduled Meds: . enoxaparin (LOVENOX) injection  40 mg Subcutaneous Q24H  . gabapentin  200 mg Oral BID  . insulin aspart  0-20 Units Subcutaneous TID WC  . insulin aspart  0-5 Units Subcutaneous QHS  . insulin detemir  26 Units Subcutaneous QHS  . pantoprazole  40 mg Oral Daily  . senna-docusate  1 tablet Oral BID   Continuous Infusions: . anidulafungin Stopped (02/14/18 0112)  . pencillin G potassium IV 4 Million Units (02/14/18 0826)   PRN Meds:.acetaminophen **OR** acetaminophen, diphenhydrAMINE, HYDROmorphone (DILAUDID) injection, ketorolac, lip balm, naLOXone (NARCAN)  injection, ondansetron **OR** ondansetron (ZOFRAN) IV, oxyCODONE, phenol, polyvinyl alcohol, promethazine, sodium chloride flush, sodium phosphate    Objective: Weight change: -6 lb 14.4 oz (-3.13 kg)  Intake/Output Summary (Last 24 hours) at 02/14/2018 0927 Last data filed at 02/14/2018 0724 Gross per 24 hour  Intake 2990 ml  Output 5900 ml  Net -2910 ml   Blood pressure (!) 169/101, pulse 89, temperature 98.3 F (36.8 C), temperature source Oral, resp. rate 20, height 6\' 3"  (1.905 m), weight 185 lb 4.8 oz (84.1 kg), SpO2 98 %. Temp:  [98.3 F (36.8 C)-99.6 F (37.6 C)] 98.3 F (36.8 C) (04/28 0514) Pulse Rate:  [67-89]  89 (04/28 0514) Resp:  [18-20] 20 (04/28 0514) BP: (156-179)/(89-101) 169/101 (04/28 0514) SpO2:  [98 %-99 %] 98 % (04/28 0514) Weight:  [185 lb 4.8 oz (84.1 kg)] 185 lb 4.8 oz (84.1 kg) (04/28 0514)  Physical Exam: General: Alert and awake, oriented x3, dishevelled HEENT: anicteric sclera,, EOMI CVS regular rate, normal r,  no murmur rubs or gallops Chest: clear to auscultation bilaterally, no wheezing, rales or rhonchi Abdomen: soft nontender, nondistended, normal bowel sounds, Extremities: His left hand is in a bandage I did not take it down today left knee also bandaged.  He has prominent tenderness in his medial thigh.   No splinters Janeway's or Osler's seen.  Neuro: nonfocal  CBC:  CBC Latest Ref Rng & Units 02/14/2018 02/13/2018  WBC 4.0 - 10.5 K/uL 10.1 8.6  Hemoglobin 13.0 - 17.0 g/dL 11.5(L) 11.1(L)  Hematocrit 39.0 - 52.0 % 33.6(L) 33.7(L)  Platelets 150 - 400 K/uL 229 212      BMET Recent Labs    02/13/18 0656 02/14/18 0419  NA 132* 135  K 4.0 4.0  CL 96* 100*  CO2 29 29  GLUCOSE 273* 237*  BUN 8 <5*  CREATININE 0.67 0.67  CALCIUM 8.6* 9.0     Liver Panel  Recent Labs    02/13/18 0656  PROT 6.7  ALBUMIN 2.9*  AST 59*  ALT 58  ALKPHOS 114  BILITOT 0.7  Sedimentation Rate No results for input(s): ESRSEDRATE in the last 72 hours. C-Reactive Protein No results for input(s): CRP in the last 72 hours.  Micro Results: No results found for this or any previous visit (from the past 720 hour(s)).  Studies/Results: No results found.    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 male with  IVDU hx of cocaine, rare heroine (he has been using rx oxycodone more so), recent admission to [facility] with left hand abscess due to group C strep infection.  He had I&D performed to their and then left AGAINST MEDICAL ADVICE.  In the interim his blood cultures turned positive for a yeast and an actinomyces species.  He was called and asked to come back to the hospital which he did.  On the day of admission he had developed left knee pain.  He was then found during his hospitalization there to have evidence of aortic and mitral valve vegetations and to have an infected left knee.  Status post arthroscopic surgery and debridement of the left knee with cultures yielding yeast as well.  He has been transferred to [facility] for evaluation by cardiothoracic surgery.  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  He now has thigh pani and his knee pain is worse  I am had an MRI of his knee today to evaluate this site I am worried that he needs an open surgery here to thoroughly clean out the knee joint.  Wanted to also get MRI of the thigh but will not get ideal studies if you try to both of them today I think if we wait to do the thigh study we may see more findings and potentially more pus for example in his thigh muscles.  4 will not do MRI of the thigh today but when next possible which will be in 48 hours  He does need his teeth evaluated and I will order CT maxillofacial later today as he cannot stand currently for the amount of time.  To clarify his history he does meet criteria for evaluation by car thoracic surgery for valve replacement based on the fact that he has fungal endocarditis.  HOWEVER I need to clarify that he did NOT develop septic embolization ON effective therapy if understand the history  correctly because his knee pain developed as an outpatient when he was not on antifungal therapy.  #2 left hand abscess: This is been I indeed by hand surgery and [facility].  Continue to monitor wound  #3 .  IV drug use uses cocaine more so than than heroin.  Could benefit from Suboxone potentially though.  Dr. Ninetta Lights will see him tomorrow.        LOS: 2 days   Acey Lav 02/14/2018, 9:27 AM
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
## 2018-02-14 NOTE — Consult Note (Addendum)
WOC Nurse wound consult note Reason for Consult: Wound consult for left hand and knee.  Ortho is simultaneously  consulted for knee and I will defer to their expertise on that joint. Input from Infectious Disease is appreciated. Wound type: Full thickness, s/p I&D on 4/19. Pressure Injury POA: NA Measurement: 5cm x 2cm x 0.5cm Wound bed: Dry, red with two sutures at distal and proximal ends Drainage (amount, consistency, odor) scant dry drainage on old dressing Periwound: intact, dry Dressing procedure/placement/frequency: I will provide Nursing with guidance via the Orders for three times daily NS dressing to the full thickness wound so that it does not dry out in-between dressing changes.  WOC nursing team will not follow, but will remain available to this patient, the nursing and medical teams.  Please re-consult if needed. Thanks, Ladona Mow, MSN, RN, GNP, [place]  Pager# [phone]

## 2018-02-14 NOTE — Progress Notes (Signed)
Pt's pain medicines given throughout the day and pain is under controlled via dilaudid, oxycodone and toradol. Vitals stable, CT scan and MRI done, wound dressing of left hand is done, will continue to monitor the patient  Lonia Farber, RN
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
## 2018-02-14 NOTE — Progress Notes (Signed)
Pt is refusing lisinopril B.P is 150 /90. RN made him aware consequences of not taking BP medicines  Lonia Farber, Charity fundraiser
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
## 2018-02-15 ENCOUNTER — Encounter (HOSPITAL_COMMUNITY): Payer: Self-pay

## 2018-02-15 ENCOUNTER — Inpatient Hospital Stay (HOSPITAL_COMMUNITY): Payer: Medicare HMO

## 2018-02-15 DIAGNOSIS — I10 Essential (primary) hypertension: Secondary | ICD-10-CM

## 2018-02-15 DIAGNOSIS — E119 Type 2 diabetes mellitus without complications: Secondary | ICD-10-CM

## 2018-02-15 DIAGNOSIS — Z794 Long term (current) use of insulin: Secondary | ICD-10-CM

## 2018-02-15 DIAGNOSIS — I339 Acute and subacute endocarditis, unspecified: Secondary | ICD-10-CM

## 2018-02-15 DIAGNOSIS — M00242 Other streptococcal arthritis, left hand: Secondary | ICD-10-CM

## 2018-02-15 DIAGNOSIS — F1410 Cocaine abuse, uncomplicated: Secondary | ICD-10-CM

## 2018-02-15 DIAGNOSIS — I5030 Unspecified diastolic (congestive) heart failure: Secondary | ICD-10-CM

## 2018-02-15 DIAGNOSIS — M00862 Arthritis due to other bacteria, left knee: Secondary | ICD-10-CM

## 2018-02-15 LAB — BASIC METABOLIC PANEL
ANION GAP: 7 (ref 5–15)
BUN: 8 mg/dL (ref 6–20)
CO2: 31 mmol/L (ref 22–32)
Calcium: 9 mg/dL (ref 8.9–10.3)
Chloride: 94 mmol/L — ABNORMAL LOW (ref 101–111)
Creatinine, Ser: 0.74 mg/dL (ref 0.61–1.24)
GFR calc Af Amer: >60 mL/min (ref >60)
GFR calc non Af Amer: >60 mL/min (ref >60)
GLUCOSE: 303 mg/dL — AB (ref 65–99)
Potassium: 4.3 mmol/L (ref 3.5–5.1)
Sodium: 132 mmol/L — ABNORMAL LOW (ref 135–145)

## 2018-02-15 LAB — GLUCOSE, CAPILLARY
GLUCOSE-CAPILLARY: 103 mg/dL — AB (ref 65–99)
GLUCOSE-CAPILLARY: 335 mg/dL — AB (ref 65–99)
Glucose-Capillary: 305 mg/dL — ABNORMAL HIGH (ref 65–99)
Glucose-Capillary: 286 mg/dL — ABNORMAL HIGH (ref 65–99)

## 2018-02-15 LAB — CBC
HEMATOCRIT: 32.8 % — AB (ref 39.0–52.0)
Hemoglobin: 11 g/dL — ABNORMAL LOW (ref 13.0–17.0)
MCH: 29 pg (ref 26.0–34.0)
MCHC: 33.5 g/dL (ref 30.0–36.0)
MCV: 86.5 fL (ref 78.0–100.0)
Platelets: 227 10*3/uL (ref 150–400)
RBC: 3.79 MIL/uL — ABNORMAL LOW (ref 4.22–5.81)
RDW: 13.9 % (ref 11.5–15.5)
WBC: 8.7 10*3/uL (ref 4.0–10.5)

## 2018-02-15 LAB — ECHOCARDIOGRAM COMPLETE
Height: 75 in
Weight: 2980.8 oz

## 2018-02-15 MED ORDER — FLUCONAZOLE IN SODIUM CHLORIDE 400-0.9 MG/200ML-% IV SOLN
800.0000 mg | Freq: Once | INTRAVENOUS | Status: AC
  Administered 2018-02-15: 800 mg via INTRAVENOUS
  Filled 2018-02-15: qty 400

## 2018-02-15 MED ORDER — FLUCONAZOLE IN SODIUM CHLORIDE 400-0.9 MG/200ML-% IV SOLN
400.0000 mg | INTRAVENOUS | Status: DC
  Administered 2018-02-16 – 2018-02-19 (×4): 400 mg via INTRAVENOUS
  Filled 2018-02-15 (×4): qty 200

## 2018-02-15 MED ORDER — HYDROMORPHONE HCL 1 MG/ML IJ SOLN
1.0000 mg | Freq: Once | INTRAMUSCULAR | Status: DC | PRN
  Filled 2018-02-15 (×3): qty 1

## 2018-02-15 MED ORDER — SODIUM CHLORIDE 0.9 % IV SOLN: 200.0000 mg | Freq: Every day | INTRAVENOUS | Status: DC

## 2018-02-15 NOTE — Progress Notes (Signed)
Pt refuses to take scheduled Lisinopril. RN educated pt. Pt still refuses. MD notified.

## 2018-02-15 NOTE — Progress Notes (Signed)
[address]       [name] [address]             [phone]                     LOS: 3 days   Subjective: Patient feels well denies fever or chills, notes his left knee and left hand are much improved with decreasing pain and swelling  Objective: Vital signs in last 24 hours: Patient Vitals for the past 24 hrs:  BP Temp Temp src Pulse Resp SpO2 Weight  02/15/18 0829 (!) 179/107 - - 80 - - -  02/15/18 0553 (!) 150/90 98.2 F (36.8 C) Oral 78 20 96 % 186 lb 4.8 oz (84.5 kg)  02/14/18 2020 (!) 142/96 98.4 F (36.9 C) Oral 75 20 99 % -  02/14/18 1533 (!) 150/90 98 F (36.7 C) Oral 88 18 98 % -    Filed Weights   02/13/18 0427 02/14/18 0514 02/15/18 0553  Weight: 191 lb 5.8 oz (86.8 kg) 185 lb 4.8 oz (84.1 kg) 186 lb 4.8 oz (84.5 kg)    Hemodynamic parameters for last 24 hours:    Intake/Output from previous day: 04/28 0701 - 04/29 0700 In: 1302 [P.O.:1302] Out: 2000 [Urine:2000] Intake/Output this shift: Total I/O In: 240 [P.O.:240] Out: -   Scheduled Meds: . enoxaparin (LOVENOX) injection  40 mg Subcutaneous Q24H  . gabapentin  200 mg Oral BID  . insulin aspart  0-20 Units Subcutaneous TID WC  . insulin aspart  0-5 Units Subcutaneous QHS  . insulin aspart  3 Units Subcutaneous TID WC  . insulin detemir  26 Units Subcutaneous QHS  . lisinopril  5 mg Oral Daily  . pantoprazole  40 mg Oral Daily  . senna-docusate  1 tablet Oral BID   Continuous Infusions: . fluconazole (DIFLUCAN) IV     Followed by  . [START ON 02/16/2018] fluconazole (DIFLUCAN) IV    . pencillin G potassium IV Stopped (02/15/18 0940)   PRN Meds:.acetaminophen **OR** acetaminophen, diphenhydrAMINE, HYDROmorphone (DILAUDID) injection, HYDROmorphone (DILAUDID) injection, ketorolac, lip balm, naLOXone (NARCAN)  injection, ondansetron **OR** ondansetron (ZOFRAN) IV, oxyCODONE, phenol, polyvinyl alcohol, promethazine, sodium chloride flush, sodium phosphate  General  appearance: alert and cooperative Neurologic: intact Heart: regular rate and rhythm, S1, S2 normal, no murmur, click, rub or gallop Lungs: diminished breath sounds bibasilar Abdomen: soft, non-tender; bowel sounds normal; no masses,  no organomegaly Extremities: Left wrist and left knee bandaged, but improving per Ortho  Lab Results: CBC: Recent Labs    02/14/18 0419 02/15/18 0359  WBC 10.1 8.7  HGB 11.5* 11.0*  HCT 33.6* 32.8*  PLT 229 227   BMET:  Recent Labs    02/14/18 0419 02/15/18 0359  NA 135 132*  K 4.0 4.3  CL 100* 94*  CO2 29 31  GLUCOSE 237* 303*  BUN <5* 8  CREATININE 0.67 0.74  CALCIUM 9.0 9.0    PT/INR: No results for input(s): LABPROT, INR in the last 72 hours.   Radiology Mr Knee Left W Wo Contrast  Result Date: 02/14/2018 CLINICAL DATA:  History of IV drug abuse with severe left knee pain and swelling. EXAM: MRI OF THE LEFT KNEE WITHOUT AND WITH CONTRAST TECHNIQUE: Multiplanar, multisequence MR imaging of the knee was performed before and after the administration of intravenous contrast. CONTRAST:  41mL MULTIHANCE GADOBENATE DIMEGLUMINE 529 MG/ML IV SOLN COMPARISON:  None. FINDINGS: Exam is limited by patient motion. There  is a very large masslike lesion involving the entire knee joint with heterogeneous T1 and T2 signal intensity. Very heterogeneous contrast enhancement. Small amount of non complex fluid. This does not have the typical appearance of septic arthritis but could be an atypical infection such as fungal disease. A massive synovial proliferative process is also possible. I do not think that arthrocentesis would be particularly helpful since there is not a lot of free fluid. Patient may require open arthrotomy. I do not see any obvious destruction of the articular cartilage. There is mild edema like signal abnormality and some enhancement in the lateral femur posteriorly which could be reactive change or possible osteomyelitis. The cruciate and  collateral ligaments are intact and the menisci appear normal. Diffuse subcutaneous soft tissue swelling/edema/fluid suggesting cellulitis. There is also mild myositis involving the vastus medialis and lateralis muscles. I suspect is been a prior arthrotomy as there appears to be some signal abnormality in the region of the medial retinaculum. A small amount of gas is noted in the joint also. Recommend correlation with prior arthrocentesis. IMPRESSION: 1. Large "masslike" lesion involving the entire knee joint most notably in the suprapatellar bursa with very heterogeneous T1 and T2 signal intensity and heterogeneous contrast enhancement. Very little free/non complex fluid. Findings could be due to atypical septic arthritis such as fungal disease. A severe synovial proliferative process is also possible but less likely. Patient may require open arthrotomy. 2. Edema like signal abnormality and some enhancement in the lateral femoral condyle posteriorly could be reactive change or possible osteomyelitis. 3. No definite destructive cartilage process. 4. Suspect prior arthrotomy with defect in the medial retinaculum. Recommend clinical correlation. Electronically Signed   By: Rudie Meyer M.D.   On: 02/14/2018 12:38   Ct Maxillofacial W Contrast  Result Date: 02/14/2018 CLINICAL DATA:  IV drug abuser. Evaluate dentition. Fungal endocarditis. EXAM: CT MAXILLOFACIAL WITH CONTRAST TECHNIQUE: Multidetector CT imaging of the maxillofacial structures was performed with intravenous contrast. Multiplanar CT image reconstructions were also generated. CONTRAST:  65mL ISOVUE-300 IOPAMIDOL (ISOVUE-300) INJECTION 61% COMPARISON:  None. FINDINGS: Osseous: There are extensive lucencies throughout the mandible and maxilla, consistent with periodontal disease. Multiple teeth are missing, possible prior extraction. All of the teeth which remain are associated with periapical lucencies, some of which involve anterior maxillary or  mandibular cortical destruction. All of the visible teeth are involved with extensive dental caries, resulting in loss of the crown. Frank mandibular osteomyelitis is not established. Orbits: Negative. No traumatic or inflammatory finding. Sinuses: Clear. Soft tissues: No evidence of facial cellulitis or subperiosteal abscess related to the mandible or maxilla. There is a subcutaneous radiodensity on the RIGHT, along the body of the mandible, which could represent a small foreign body or dystrophic calcification. See axial series 4, image 22. Limited intracranial: Negative. IMPRESSION: Multiple teeth are missing, but the teeth which remain are uniformly involved with dental caries and periapical lucencies. No frank mandibular osteomyelitis. No visible facial cellulitis. 2 mm subcutaneous radiodensity in the RIGHT face along the body of the mandible; uncertain significance. Electronically Signed   By: Elsie Stain M.D.   On: 02/14/2018 12:09   Micro: 4/19 Blood culture x3 - 1 of 3 positive for Actinomyces israelii, 1 of 3 positive for yeast 4/19 intra-op hand culture - postive for group C strep 4/21 blood culture x2 - NGTD 4/23 blood culture x2 - NGTD 4/23 knee aspirate - yeast Both yeast cultures await final results     Assessment/Plan: Await delivery of imaging  from [facility], TEE imaging disc was unsatisfactory Will need follow-up echocardiogram here including TEE Needs dental consultation   Delight Ovens MD 02/15/2018 10:58 AM     Patient ID: Derek Mitchell, male   DOB: 10-31-1970, 47 y.o.   MRN: [id number]
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
## 2018-02-15 NOTE — Progress Notes (Signed)
Patient asking for pain meds at each interval PRNS are available,   Pt calls RN for meds.  Pain level sustained at 7-8/10 prior to pain meds, states pain reduced to 6/10 within and hour then raises again.

## 2018-02-15 NOTE — Progress Notes (Signed)
Echocardiogram 2D Echocardiogram has been performed.  02/15/2018 2:00 PM Gertie Fey, BS, RVT, RDCS, RDMS
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
## 2018-02-15 NOTE — Progress Notes (Signed)
Palliative Medicine Consult order noted. Patient discussed in team rounds with providers, including PMT medical director, Dr Phillips Odor. Our team manages pain and symptoms related to cancer and other terminal diagnoses. For post-op, chronic, or acute pain concerns, please consult pain management or anesthesia as appropriate.   Consult order will be cancelled.  Margret Chance Kynadie Yaun, RN, BSN, [facility] Palliative Medicine Team 02/15/2018 9:38 AM Office [phone]

## 2018-02-15 NOTE — Progress Notes (Signed)
Orthopaedic Trauma Progress Note  S: Doing better this AM, compression on knee has help and pain gotten better  O:  Vitals:   02/15/18 0553 02/15/18 0829  BP: (!) 150/90 (!) 179/107  Pulse: 78 80  Resp: 20   Temp: 98.2 F (36.8 C)   SpO2: 96%    LLE: Continued with swelling and fullness in suprapatellar pouch. Knee ROM is painless except for extremes of ROM, 2+ edema in lower leg  Labs:  CBC    Component Value Date/Time   WBC 8.7 02/15/2018 0359   RBC 3.79 (L) 02/15/2018 0359   HGB 11.0 (L) 02/15/2018 0359   HCT 32.8 (L) 02/15/2018 0359   PLT 227 02/15/2018 0359   MCV 86.5 02/15/2018 0359   MCH 29.0 02/15/2018 0359   MCHC 33.5 02/15/2018 0359   RDW 13.9 02/15/2018 03559    A/P: 47 year old male with fungal infection and left knee septic arthritis s/p arthroscopic I&D at [facility] on 4/26  Clinical exam is improving. Continue antifungal therapy and compression and monitor knee. No need for I&D at this point.  Weightbearing: WBAT LLE Insicional and dressing care: Compressive ACE wrap Orthopedic device(s): None Showering: Okay to shower from orthopaedic perspective VTE prophylaxis: per primary team  Pain control: per primary team Follow - up plan: TBD  Roby Lofts, MD [facility] [phone] (phone)

## 2018-02-15 NOTE — Progress Notes (Signed)
Patient asking to see Pain Management today

## 2018-02-15 NOTE — Progress Notes (Addendum)
INFECTIOUS DISEASE PROGRESS NOTE  ID: Derek Mitchell is a 47 y.o. 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   Group C streptococcal infection   Aortic valve endocarditis   Arthritis, septic, knee (HCC)   Fungal arthritis (HCC)  Subjective: No complaints  Abtx:  Anti-infectives (From admission, onward)   Start     Dose/Rate Route Frequency Ordered Stop   02/16/18 2200  fluconazole (DIFLUCAN) IVPB 400 mg     400 mg 100 mL/hr over 120 Minutes Intravenous Every 24 hours 02/15/18 0955     02/15/18 2200  anidulafungin (ERAXIS) 200 mg in sodium chloride 0.9 % 200 mL IVPB  Status:  Discontinued     200 mg 78 mL/hr over 200 Minutes Intravenous Daily at bedtime 02/15/18 0843 02/15/18 0955   02/15/18 2200  fluconazole (DIFLUCAN) IVPB 800 mg     800 mg 200 mL/hr over 120 Minutes Intravenous  Once 02/15/18 0955     02/13/18 0000  anidulafungin (ERAXIS) 100 mg in sodium chloride 0.9 % 100 mL IVPB  Status:  Discontinued     100 mg 78 mL/hr over 100 Minutes Intravenous Daily at bedtime 02/12/18 2306 02/15/18 0843   02/13/18 0000  penicillin G potassium injection 4 Million Units  Status:  Discontinued     4 Million Units Intramuscular Every 4 hours 02/12/18 2306 02/12/18 2351   02/13/18 0000  penicillin G potassium 4 Million Units in dextrose 5 % 250 mL IVPB     4 Million Units 250 mL/hr over 60 Minutes Intravenous Every 4 hours 02/12/18 2351        Medications:  Scheduled: . enoxaparin (LOVENOX) injection  40 mg Subcutaneous Q24H  . gabapentin  200 mg Oral BID  . insulin aspart  0-20 Units Subcutaneous TID WC  . insulin aspart  0-5 Units Subcutaneous QHS  . insulin aspart  3 Units Subcutaneous TID WC  . insulin detemir  26 Units Subcutaneous QHS  . lisinopril  5 mg Oral Daily    . pantoprazole  40 mg Oral Daily  . senna-docusate  1 tablet Oral BID    Objective: Vital signs in last 24 hours: Temp:  [98 F (36.7 C)-98.4 F (36.9 C)] 98.2 F (36.8 C) (04/29 0553) Pulse Rate:  [75-88] 80 (04/29 0829) Resp:  [18-20] 20 (04/29 0553) BP: (142-179)/(90-107) 179/107 (04/29 0829) SpO2:  [96 %-99 %] 96 % (04/29 0553) Weight:  [84.5 kg (186 lb 4.8 oz)] 84.5 kg (186 lb 4.8 oz) (04/29 0553)   General appearance: alert, cooperative and no distress Resp: clear to auscultation bilaterally Cardio: regular rate and rhythm GI: normal findings: bowel sounds normal and soft, non-tender Extremities: L hand and wrist wrapped.   Lab Results Recent Labs    02/14/18 0419 02/15/18 0359  WBC 10.1 8.7  HGB 11.5* 11.0*  HCT 33.6* 32.8*  NA 135 132*  K 4.0 4.3  CL 100* 94*  CO2 29 31  BUN <5* 8  CREATININE 0.67 0.74   Liver Panel Recent Labs    02/13/18 0656  PROT 6.7  ALBUMIN 2.9*  AST 59*  ALT 58  ALKPHOS 114  BILITOT 0.7   Sedimentation Rate No results for input(s): ESRSEDRATE in the last 72 hours. C-Reactive Protein No results for input(s): CRP  in the last 72 hours.  Microbiology: No results found for this or any previous visit (from the past 240 hour(s)).  Studies/Results: Mr Knee Left W Wo Contrast  Result Date: 02/14/2018 CLINICAL DATA:  History of IV drug abuse with severe left knee pain and swelling. EXAM: MRI OF THE LEFT KNEE WITHOUT AND WITH CONTRAST TECHNIQUE: Multiplanar, multisequence MR imaging of the knee was performed before and after the administration of intravenous contrast. CONTRAST:  79mL MULTIHANCE GADOBENATE DIMEGLUMINE 529 MG/ML IV SOLN COMPARISON:  None. FINDINGS: Exam is limited by patient motion. There is a very large masslike lesion involving the entire knee joint with heterogeneous T1 and T2 signal intensity. Very heterogeneous contrast enhancement. Small amount of non complex fluid. This does not have the typical appearance of  septic arthritis but could be an atypical infection such as fungal disease. A massive synovial proliferative process is also possible. I do not think that arthrocentesis would be particularly helpful since there is not a lot of free fluid. Patient may require open arthrotomy. I do not see any obvious destruction of the articular cartilage. There is mild edema like signal abnormality and some enhancement in the lateral femur posteriorly which could be reactive change or possible osteomyelitis. The cruciate and collateral ligaments are intact and the menisci appear normal. Diffuse subcutaneous soft tissue swelling/edema/fluid suggesting cellulitis. There is also mild myositis involving the vastus medialis and lateralis muscles. I suspect is been a prior arthrotomy as there appears to be some signal abnormality in the region of the medial retinaculum. A small amount of gas is noted in the joint also. Recommend correlation with prior arthrocentesis. IMPRESSION: 1. Large "masslike" lesion involving the entire knee joint most notably in the suprapatellar bursa with very heterogeneous T1 and T2 signal intensity and heterogeneous contrast enhancement. Very little free/non complex fluid. Findings could be due to atypical septic arthritis such as fungal disease. A severe synovial proliferative process is also possible but less likely. Patient may require open arthrotomy. 2. Edema like signal abnormality and some enhancement in the lateral femoral condyle posteriorly could be reactive change or possible osteomyelitis. 3. No definite destructive cartilage process. 4. Suspect prior arthrotomy with defect in the medial retinaculum. Recommend clinical correlation. Electronically Signed   By: Rudie Meyer M.D.   On: 02/14/2018 12:38   Ct Maxillofacial W Contrast  Result Date: 02/14/2018 CLINICAL DATA:  IV drug abuser. Evaluate dentition. Fungal endocarditis. EXAM: CT MAXILLOFACIAL WITH CONTRAST TECHNIQUE: Multidetector CT  imaging of the maxillofacial structures was performed with intravenous contrast. Multiplanar CT image reconstructions were also generated. CONTRAST:  16mL ISOVUE-300 IOPAMIDOL (ISOVUE-300) INJECTION 61% COMPARISON:  None. FINDINGS: Osseous: There are extensive lucencies throughout the mandible and maxilla, consistent with periodontal disease. Multiple teeth are missing, possible prior extraction. All of the teeth which remain are associated with periapical lucencies, some of which involve anterior maxillary or mandibular cortical destruction. All of the visible teeth are involved with extensive dental caries, resulting in loss of the crown. Frank mandibular osteomyelitis is not established. Orbits: Negative. No traumatic or inflammatory finding. Sinuses: Clear. Soft tissues: No evidence of facial cellulitis or subperiosteal abscess related to the mandible or maxilla. There is a subcutaneous radiodensity on the RIGHT, along the body of the mandible, which could represent a small foreign body or dystrophic calcification. See axial series 4, image 22. Limited intracranial: Negative. IMPRESSION: Multiple teeth are missing, but the teeth which remain are uniformly involved with dental caries and periapical lucencies. No frank mandibular osteomyelitis.  No visible facial cellulitis. 2 mm subcutaneous radiodensity in the RIGHT face along the body of the mandible; uncertain significance. Electronically Signed   By: Elsie Stain M.D.   On: 02/14/2018 12:09     Assessment/Plan: Endocarditis (MV and Ao) Fungemia   C parapsilosis MIC flucon 1 Actinomyces bacteremia L hand septic arthritis (debrided 4-19)  Group C strep L knee septic arthritis  Yeast on aspirate IVDA (cocaine)   Active last 2 weeks ago Hypertension Dental carries, no abscess IDDM  Total days of antibiotics: 8 (antifungal, PEN)  Agee with changing his caspo --> flucon as his isolate is sensitive.  Continue PEN awiat repeat TTE Watch his L  knee , need for further I & D Dental f/u Blood pressure control Check HIV, Hepatitis serologies Appreciate pharm management         Johny Sax MD, FACP Infectious Diseases (pager) [phone] [email or web address] 02/15/2018, 9:55 AM  LOS: 3 days

## 2018-02-15 NOTE — Progress Notes (Signed)
Primary Rn called charge RN because patient is upset about his PICC line. Per primary RN, she paused infusion to change out abx  . Patient refused RN to restart infusion and wanted Iv team to flush line before restarting infusion. Consult was placed by primary RN. Patient requested pain med and wanted RN to put it in the secondary lumen while we waited for IV team RN. RN explained to patient that only labs could be drawn from this line. Charge RN was called into patient's room. Charge Rn explained to patient that only Iv team nurses could access Picc in this way and that if he wanted IV team to flush line, they would do so but we would have to wait to give him his pain medicine as floor nurses are not allowed to push anything in the other line. Patient was upset and stated that he felt his needs were not being met. Charge RN educated patient on his Picc and assured him Iv team RN would be in to see him. . IV RN arrived  to floor to flush line. Patient refused line care by IV team RN. Patient requested something to drink. Rn provided. Pain medication given as scheduled by primary RN.  No other complaints at this time.

## 2018-02-15 NOTE — Care Management Note (Signed)
Case Management Note  Patient Details  Name: ADHRIT KRENZ MRN: [id number] Date of Birth: 1971-10-11  Subjective/Objective:     Fungal Endocarditis              Action/Plan: Lives at home with spouse; PCP: Jearld Lesch, MD; has private insurance with [facility] [employer] with prescription; CM will continue to follow for progression of care.   Expected Discharge Date:    possibly 02/21/2018              Expected Discharge Plan:  Home/Self Care  Discharge planning Services  CM Consult  Status of Service:  In process, will continue to follow  Reola Mosher [phone] 02/15/2018, 10:10 AM

## 2018-02-16 DIAGNOSIS — B9689 Other specified bacterial agents as the cause of diseases classified elsewhere: Secondary | ICD-10-CM

## 2018-02-16 LAB — CBC
HEMATOCRIT: 32.5 % — AB (ref 39.0–52.0)
Hemoglobin: 11 g/dL — ABNORMAL LOW (ref 13.0–17.0)
MCH: 29.4 pg (ref 26.0–34.0)
MCHC: 33.8 g/dL (ref 30.0–36.0)
MCV: 86.9 fL (ref 78.0–100.0)
Platelets: 285 10*3/uL (ref 150–400)
RBC: 3.74 MIL/uL — ABNORMAL LOW (ref 4.22–5.81)
RDW: 14.2 % (ref 11.5–15.5)
WBC: 10.9 10*3/uL — ABNORMAL HIGH (ref 4.0–10.5)

## 2018-02-16 LAB — GLUCOSE, CAPILLARY
GLUCOSE-CAPILLARY: 304 mg/dL — AB (ref 65–99)
GLUCOSE-CAPILLARY: 218 mg/dL — AB (ref 65–99)
Glucose-Capillary: 292 mg/dL — ABNORMAL HIGH (ref 65–99)
Glucose-Capillary: 316 mg/dL — ABNORMAL HIGH (ref 65–99)

## 2018-02-16 LAB — BASIC METABOLIC PANEL
Anion gap: 6 (ref 5–15)
BUN: 6 mg/dL (ref 6–20)
CALCIUM: 8.9 mg/dL (ref 8.9–10.3)
CHLORIDE: 95 mmol/L — AB (ref 101–111)
CO2: 29 mmol/L (ref 22–32)
CREATININE: 0.72 mg/dL (ref 0.61–1.24)
GFR calc non Af Amer: >60 mL/min (ref >60)
GLUCOSE: 360 mg/dL — AB (ref 65–99)
Potassium: 4.7 mmol/L (ref 3.5–5.1)
Sodium: 130 mmol/L — ABNORMAL LOW (ref 135–145)

## 2018-02-16 LAB — HEPATITIS PANEL, ACUTE
HCV Ab: >11 s/co ratio — ABNORMAL HIGH (ref 0.0–0.9)
HEP B S AG: NEGATIVE
Hep A IgM: NEGATIVE
Hep B C IgM: NEGATIVE

## 2018-02-16 LAB — HIV ANTIBODY (ROUTINE TESTING W REFLEX): HIV Screen 4th Generation wRfx: NONREACTIVE

## 2018-02-16 MED ORDER — KETOROLAC TROMETHAMINE 30 MG/ML IJ SOLN
30.0000 mg | Freq: Four times a day (QID) | INTRAMUSCULAR | Status: AC | PRN
  Administered 2018-02-16 – 2018-02-18 (×5): 30 mg via INTRAVENOUS
  Filled 2018-02-16 (×5): qty 1

## 2018-02-16 MED ORDER — INSULIN DETEMIR 100 UNIT/ML ~~LOC~~ SOLN
40.0000 [IU] | Freq: Every day | SUBCUTANEOUS | Status: DC
  Administered 2018-02-16 – 2018-02-19 (×4): 40 [IU] via SUBCUTANEOUS
  Filled 2018-02-16 (×4): qty 0.4

## 2018-02-16 MED ORDER — INSULIN ASPART 100 UNIT/ML ~~LOC~~ SOLN
0.0000 [IU] | Freq: Three times a day (TID) | SUBCUTANEOUS | Status: DC
  Administered 2018-02-16: 8 [IU] via SUBCUTANEOUS

## 2018-02-16 MED ORDER — INSULIN ASPART 100 UNIT/ML ~~LOC~~ SOLN
5.0000 [IU] | Freq: Three times a day (TID) | SUBCUTANEOUS | Status: DC
  Administered 2018-02-16: 5 [IU] via SUBCUTANEOUS

## 2018-02-16 NOTE — Care Management Important Message (Signed)
Important Message  Patient Details  Name: Derek Mitchell MRN: [id number] Date of Birth: Dec 13, 1970   Medicare Important Message Given:  Yes    [name] Stefan Church 02/16/2018, 11:54 AM

## 2018-02-16 NOTE — Progress Notes (Signed)
[address]       [name] [address]             [phone]                     LOS: 4 days   Subjective: Asking for pain med for right knee  no signs of heart failure , no fever  Objective: Vital signs in last 24 hours: Patient Vitals for the past 24 hrs:  BP Temp Temp src Pulse Resp SpO2  02/16/18 1420 (!) 176/104 98.9 F (37.2 C) Oral 89 18 99 %  02/16/18 0800 (!) 162/88 - - - - -  02/16/18 0657 (!) 150/88 98.9 F (37.2 C) Oral 96 16 97 %  02/15/18 2045 120/62 98.4 F (36.9 C) Oral 96 20 98 %    Filed Weights   02/13/18 0427 02/14/18 0514 02/15/18 0553  Weight: 191 lb 5.8 oz (86.8 kg) 185 lb 4.8 oz (84.1 kg) 186 lb 4.8 oz (84.5 kg)    Hemodynamic parameters for last 24 hours:    Intake/Output from previous day: 04/29 0701 - 04/30 0700 In: 1134 [P.O.:484; IV Piggyback:650] Out: 1400 [Urine:1400] Intake/Output this shift: Total I/O In: 720 [P.O.:720] Out: 2075 [Urine:2075]  Scheduled Meds: . enoxaparin (LOVENOX) injection  40 mg Subcutaneous Q24H  . gabapentin  200 mg Oral BID  . insulin aspart  0-15 Units Subcutaneous TID WC  . insulin aspart  0-5 Units Subcutaneous QHS  . insulin aspart  5 Units Subcutaneous TID WC  . insulin detemir  40 Units Subcutaneous QHS  . lisinopril  5 mg Oral Daily  . pantoprazole  40 mg Oral Daily  . senna-docusate  1 tablet Oral BID   Continuous Infusions: . fluconazole (DIFLUCAN) IV    . pencillin G potassium IV Stopped (02/16/18 1259)   PRN Meds:.acetaminophen **OR** acetaminophen, diphenhydrAMINE, HYDROmorphone (DILAUDID) injection, HYDROmorphone (DILAUDID) injection, ketorolac, lip balm, naLOXone (NARCAN)  injection, ondansetron **OR** ondansetron (ZOFRAN) IV, oxyCODONE, phenol, polyvinyl alcohol, promethazine, sodium chloride flush, sodium phosphate  General appearance: alert and cooperative Neurologic: intact Heart: regular rate and rhythm, S1, S2 normal, no murmur, click, rub or  gallop Lungs: clear to auscultation bilaterally Abdomen: soft, non-tender; bowel sounds normal; no masses,  no organomegaly Left  wrist and left knee bandaged   Lab Results: CBC: Recent Labs    02/14/18 0419 02/15/18 0359  WBC 10.1 8.7  HGB 11.5* 11.0*  HCT 33.6* 32.8*  PLT 229 227   BMET:  Recent Labs    02/14/18 0419 02/15/18 0359  NA 135 132*  K 4.0 4.3  CL 100* 94*  CO2 29 31  GLUCOSE 237* 303*  BUN <5* 8  CREATININE 0.67 0.74  CALCIUM 9.0 9.0    PT/INR: No results for input(s): LABPROT, INR in the last 72 hours.   Radiology No results found.  Recent Results (from the past 720 hour(s))  Culture, blood (routine x 2)     Status: None (Preliminary result)   Collection Time: 02/14/18  2:27 PM  Result Value Ref Range Status   Specimen Description BLOOD FOOT RIGHT  Final   Special Requests   Final    AEROBIC BOTTLE ONLY Blood Culture results may not be optimal due to an inadequate volume of blood received in culture bottles   Culture   Final    NO GROWTH 2 DAYS Performed at [facility], [address], [place], [place] [address]  Report Status PENDING  Incomplete  Culture, blood (routine x 2)     Status: None (Preliminary result)   Collection Time: 02/14/18  2:27 PM  Result Value Ref Range Status   Specimen Description BLOOD RIGHT FOOT  Final   Special Requests   Final    AEROBIC BOTTLE ONLY Blood Culture results may not be optimal due to an inadequate volume of blood received in culture bottles   Culture   Final    NO GROWTH 2 DAYS Performed at [facility], [address], [place], [place] [address]    Report Status PENDING  Incomplete   Assessment/Plan: Follow up TTE- results noted  Continue current treatment plan, not planning avr, mvr with curent information  Will need fu TEE  Delight Ovens MD 02/16/2018 3:31 PM     Patient ID: Gretchen Short, male   DOB: 13-Aug-1971, 47 y.o.   MRN: [id number]

## 2018-02-16 NOTE — Progress Notes (Signed)
Iv team consult was placed and charge RN explained to patient that only Iv team nurses can access the PICC line to flush and patient will have to wait on pain medicine because we cannot put medicine through the other lumen. Patient decided that he did not want to wait for IV team and wanted to have pain medicine. IV team came to patient to flush IV but patient denied.   Elsie Lincoln, RN

## 2018-02-16 NOTE — Progress Notes (Signed)
MD was paged [phone] because patient has IV Dilaudid Q3h and Norco Q4H and wants to have them both within less than hour of each and complains of 10/10  Pain. MD did not place any no orders and will continue with current orders.  Elsie Lincoln, RN

## 2018-02-16 NOTE — Progress Notes (Signed)
Inpatient Diabetes Program Recommendations  AACE/ADA: New Consensus Statement on Inpatient Glycemic Control (2015)  Target Ranges:  Prepandial:   less than 140 mg/dL      Peak postprandial:   less than 180 mg/dL (1-2 hours)      Critically ill patients:  140 - 180 mg/dL   Lab Results  Component Value Date   GLUCAP 218 (H) 02/16/2018    Review of Glycemic Control Results for [name], [name] (MRN [id number]) as of 02/16/2018 12:37  Ref. Range 02/15/2018 12:15 02/15/2018 16:49 02/15/2018 21:50 02/16/2018 07:38 02/16/2018 12:17  Glucose-Capillary Latest Ref Range: 65 - 99 mg/dL 818 (H) 299 (H) 371 (H) 304 (H) 218 (H)   Diabetes history: DM2 Outpatient Diabetes medications: Levemir 80 units qd Current orders for Inpatient glycemic control: Levemir 26 units qd + Novolog 3 units tid meal coverage + Novolog resistance correction scale tid  Inpatient Diabetes Program Recommendations:   -Increase Levemir to 40 units qd (50% home dose) -Increase Novolog to 5 units tid meal coverage if eats 50% (noted patient changed to regular diet from carb modified) -Decrease Novolog correction scale to moderate tid  Thank you, Derek Mitchell. Derek Nations, RN, MSN, CDE  Diabetes Coordinator Inpatient Glycemic Control Team Team Pager [phone] (8am-5pm) 02/16/2018 12:41 PM

## 2018-02-16 NOTE — Progress Notes (Signed)
Patient was upset about IV being paused to change out abx. Patient wanted IV team to flush line and to speak with the charge RN before restarting infusion.   Elsie Lincoln, RN

## 2018-02-16 NOTE — Progress Notes (Signed)
INFECTIOUS DISEASE PROGRESS NOTE  ID: Derek Mitchell is a 47 y.o. 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   Abtx:  Anti-infectives (From admission, onward)   Start     Dose/Rate Route Frequency Ordered Stop   02/16/18 2200  fluconazole (DIFLUCAN) IVPB 400 mg     400 mg 100 mL/hr over 120 Minutes Intravenous Every 24 hours 02/15/18 0955     02/15/18 2200  anidulafungin (ERAXIS) 200 mg in sodium chloride 0.9 % 200 mL IVPB  Status:  Discontinued     200 mg 78 mL/hr over 200 Minutes Intravenous Daily at bedtime 02/15/18 0843 02/15/18 0955   02/15/18 2200  fluconazole (DIFLUCAN) IVPB 800 mg     800 mg 200 mL/hr over 120 Minutes Intravenous  Once 02/15/18 0955 02/16/18 0144   02/13/18 0000  anidulafungin (ERAXIS) 100 mg in sodium chloride 0.9 % 100 mL IVPB  Status:  Discontinued     100 mg 78 mL/hr over 100 Minutes Intravenous Daily at bedtime 02/12/18 2306 02/15/18 0843   02/13/18 0000  penicillin G potassium injection 4 Million Units  Status:  Discontinued     4 Million Units Intramuscular Every 4 hours 02/12/18 2306 02/12/18 2351   02/13/18 0000  penicillin G potassium 4 Million Units in dextrose 5 % 250 mL IVPB     4 Million Units 250 mL/hr over 60 Minutes Intravenous Every 4 hours 02/12/18 2351        Medications:  Scheduled: . enoxaparin (LOVENOX) injection  40 mg Subcutaneous Q24H  . gabapentin  200 mg Oral BID  . insulin aspart  0-20 Units Subcutaneous TID WC  . insulin aspart  0-5 Units Subcutaneous QHS  . insulin aspart  3 Units Subcutaneous TID WC  . insulin detemir  26 Units Subcutaneous QHS  . lisinopril  5  mg Oral Daily  . pantoprazole  40 mg Oral Daily  . senna-docusate  1 tablet Oral BID    Objective: Vital signs in last 24 hours: Temp:  [97.9 F (36.6 C)-98.9 F (37.2 C)] 98.9 F (37.2 C) (04/30 0657) Pulse Rate:  [80-96] 96 (04/30 0657) Resp:  [16-20] 16 (04/30 0657) BP: (120-162)/(62-104) 162/88 (04/30 0800) SpO2:  [97 %-98 %] 97 % (04/30 0657)   General appearance: alert, cooperative and no distress Resp: clear to auscultation bilaterally Cardio: tachycardia GI: normal findings: bowel sounds normal and soft, non-tender Extremities: L knee wrapped.   Lab Results Recent Labs    02/14/18 0419 02/15/18 0359  WBC 10.1 8.7  HGB 11.5* 11.0*  HCT 33.6* 32.8*  NA 135 132*  K 4.0 4.3  CL 100* 94*  CO2 29 31  BUN <5* 8  CREATININE 0.67 0.74   Liver Panel No results for input(s): PROT, ALBUMIN, AST, ALT, ALKPHOS, BILITOT, BILIDIR, IBILI in the last 72 hours. Sedimentation Rate No results for input(s): ESRSEDRATE in the last 72 hours. C-Reactive Protein No results for input(s): CRP in the last 72 hours.  Microbiology: Recent Results (from the past 240 hour(s))  Culture, blood (routine x 2)     Status: None (Preliminary result)  Collection Time: 02/14/18  2:27 PM  Result Value Ref Range Status   Specimen Description BLOOD FOOT RIGHT  Final   Special Requests   Final    AEROBIC BOTTLE ONLY Blood Culture results may not be optimal due to an inadequate volume of blood received in culture bottles   Culture   Final    NO GROWTH 2 DAYS Performed at [facility], [address], [place], [place] [address]    Report Status PENDING  Incomplete  Culture, blood (routine x 2)     Status: None (Preliminary result)   Collection Time: 02/14/18  2:27 PM  Result Value Ref Range Status   Specimen Description BLOOD RIGHT FOOT  Final   Special Requests   Final    AEROBIC BOTTLE ONLY Blood Culture results may not be optimal due to an inadequate volume of blood received in  culture bottles   Culture   Final    NO GROWTH 2 DAYS Performed at [facility], [address] [place], [place] [address]    Report Status PENDING  Incomplete    Studies/Results: Mr Knee Left W Wo Contrast  Result Date: 02/14/2018 CLINICAL DATA:  History of IV drug abuse with severe left knee pain and swelling. EXAM: MRI OF THE LEFT KNEE WITHOUT AND WITH CONTRAST TECHNIQUE: Multiplanar, multisequence MR imaging of the knee was performed before and after the administration of intravenous contrast. CONTRAST:  57mL MULTIHANCE GADOBENATE DIMEGLUMINE 529 MG/ML IV SOLN COMPARISON:  None. FINDINGS: Exam is limited by patient motion. There is a very large masslike lesion involving the entire knee joint with heterogeneous T1 and T2 signal intensity. Very heterogeneous contrast enhancement. Small amount of non complex fluid. This does not have the typical appearance of septic arthritis but could be an atypical infection such as fungal disease. A massive synovial proliferative process is also possible. I do not think that arthrocentesis would be particularly helpful since there is not a lot of free fluid. Patient may require open arthrotomy. I do not see any obvious destruction of the articular cartilage. There is mild edema like signal abnormality and some enhancement in the lateral femur posteriorly which could be reactive change or possible osteomyelitis. The cruciate and collateral ligaments are intact and the menisci appear normal. Diffuse subcutaneous soft tissue swelling/edema/fluid suggesting cellulitis. There is also mild myositis involving the vastus medialis and lateralis muscles. I suspect is been a prior arthrotomy as there appears to be some signal abnormality in the region of the medial retinaculum. A small amount of gas is noted in the joint also. Recommend correlation with prior arthrocentesis. IMPRESSION: 1. Large "masslike" lesion involving the entire knee joint most notably in the  suprapatellar bursa with very heterogeneous T1 and T2 signal intensity and heterogeneous contrast enhancement. Very little free/non complex fluid. Findings could be due to atypical septic arthritis such as fungal disease. A severe synovial proliferative process is also possible but less likely. Patient may require open arthrotomy. 2. Edema like signal abnormality and some enhancement in the lateral femoral condyle posteriorly could be reactive change or possible osteomyelitis. 3. No definite destructive cartilage process. 4. Suspect prior arthrotomy with defect in the medial retinaculum. Recommend clinical correlation. Electronically Signed   By: Rudie Meyer M.D.   On: 02/14/2018 12:38     Assessment/Plan: Endocarditis (MV and Ao) Fungemia              C parapsilosis MIC flucon 1 Actinomyces bacteremia L hand septic arthritis (  debrided 4-19)             Group C strep L knee septic arthritis             Yeast on aspirate IVDA (cocaine)              Active last 2 weeks ago Hypertension Dental carries, no abscess IDDM   Total days of antibiotics: 9 (caspo --> flucon, PEN)  Dental eval pending Continue PEN and flucon Pain mgmt Check Hep C RNA, Geno Repeat BCx 4-28 pending Appreciate CVTS eval.          Johny Sax MD, FACP Infectious Diseases (pager) [phone] [email or web address] 02/16/2018, 11:07 AM  LOS: 4 days
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
## 2018-02-17 ENCOUNTER — Inpatient Hospital Stay (HOSPITAL_COMMUNITY): Payer: Medicare HMO

## 2018-02-17 DIAGNOSIS — I10 Essential (primary) hypertension: Secondary | ICD-10-CM

## 2018-02-17 DIAGNOSIS — F1990 Other psychoactive substance use, unspecified, uncomplicated: Secondary | ICD-10-CM

## 2018-02-17 LAB — CBC
HCT: 31.2 % — ABNORMAL LOW (ref 39.0–52.0)
Hemoglobin: 10.6 g/dL — ABNORMAL LOW (ref 13.0–17.0)
MCH: 29.4 pg (ref 26.0–34.0)
MCHC: 34 g/dL (ref 30.0–36.0)
MCV: 86.4 fL (ref 78.0–100.0)
Platelets: 309 10*3/uL (ref 150–400)
RBC: 3.61 MIL/uL — AB (ref 4.22–5.81)
RDW: 14 % (ref 11.5–15.5)
WBC: 10.8 10*3/uL — ABNORMAL HIGH (ref 4.0–10.5)

## 2018-02-17 LAB — BASIC METABOLIC PANEL
Anion gap: 9 (ref 5–15)
BUN: 8 mg/dL (ref 6–20)
CALCIUM: 9.1 mg/dL (ref 8.9–10.3)
CHLORIDE: 95 mmol/L — AB (ref 101–111)
CO2: 30 mmol/L (ref 22–32)
Creatinine, Ser: 0.68 mg/dL (ref 0.61–1.24)
GFR calc Af Amer: >60 mL/min (ref >60)
GFR calc non Af Amer: >60 mL/min (ref >60)
GLUCOSE: 218 mg/dL — AB (ref 65–99)
Potassium: 4.4 mmol/L (ref 3.5–5.1)
Sodium: 134 mmol/L — ABNORMAL LOW (ref 135–145)

## 2018-02-17 LAB — GLUCOSE, CAPILLARY
GLUCOSE-CAPILLARY: 215 mg/dL — AB (ref 65–99)
Glucose-Capillary: 315 mg/dL — ABNORMAL HIGH (ref 65–99)
Glucose-Capillary: 268 mg/dL — ABNORMAL HIGH (ref 65–99)
Glucose-Capillary: 230 mg/dL — ABNORMAL HIGH (ref 65–99)

## 2018-02-17 MED ORDER — INSULIN ASPART 100 UNIT/ML ~~LOC~~ SOLN
0.0000 [IU] | Freq: Three times a day (TID) | SUBCUTANEOUS | Status: DC
  Administered 2018-02-17: 11 [IU] via SUBCUTANEOUS
  Administered 2018-02-17: 5 [IU] via SUBCUTANEOUS
  Administered 2018-02-17: 8 [IU] via SUBCUTANEOUS
  Administered 2018-02-18: 11 [IU] via SUBCUTANEOUS
  Administered 2018-02-18 (×2): 5 [IU] via SUBCUTANEOUS
  Administered 2018-02-19: 11 [IU] via SUBCUTANEOUS
  Administered 2018-02-19: 3 [IU] via SUBCUTANEOUS
  Administered 2018-02-20: 5 [IU] via SUBCUTANEOUS

## 2018-02-17 MED ORDER — LABETALOL HCL 5 MG/ML IV SOLN: 10.0000 mg | INTRAVENOUS | Status: DC | PRN

## 2018-02-17 MED ORDER — GADOBENATE DIMEGLUMINE 529 MG/ML IV SOLN
20.0000 mL | Freq: Once | INTRAVENOUS | Status: AC
  Administered 2018-02-17: 17 mL via INTRAVENOUS

## 2018-02-17 MED ORDER — INSULIN ASPART 100 UNIT/ML ~~LOC~~ SOLN
5.0000 [IU] | Freq: Three times a day (TID) | SUBCUTANEOUS | Status: DC
  Administered 2018-02-17 – 2018-02-20 (×9): 5 [IU] via SUBCUTANEOUS

## 2018-02-17 NOTE — Progress Notes (Signed)
INFECTIOUS DISEASE PROGRESS NOTE  ID: Derek Mitchell is a 47 y.o. 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   Abtx:  Anti-infectives (From admission, onward)   Start     Dose/Rate Route Frequency Ordered Stop   02/16/18 2200  fluconazole (DIFLUCAN) IVPB 400 mg     400 mg 100 mL/hr over 120 Minutes Intravenous Every 24 hours 02/15/18 0955     02/15/18 2200  anidulafungin (ERAXIS) 200 mg in sodium chloride 0.9 % 200 mL IVPB  Status:  Discontinued     200 mg 78 mL/hr over 200 Minutes Intravenous Daily at bedtime 02/15/18 0843 02/15/18 0955   02/15/18 2200  fluconazole (DIFLUCAN) IVPB 800 mg     800 mg 200 mL/hr over 120 Minutes Intravenous  Once 02/15/18 0955 02/16/18 0144   02/13/18 0000  anidulafungin (ERAXIS) 100 mg in sodium chloride 0.9 % 100 mL IVPB  Status:  Discontinued     100 mg 78 mL/hr over 100 Minutes Intravenous Daily at bedtime 02/12/18 2306 02/15/18 0843   02/13/18 0000  penicillin G potassium injection 4 Million Units  Status:  Discontinued     4 Million Units Intramuscular Every 4 hours 02/12/18 2306 02/12/18 2351   02/13/18 0000  penicillin G potassium 4 Million Units in dextrose 5 % 250 mL IVPB     4 Million Units 250 mL/hr over 60 Minutes Intravenous Every 4 hours 02/12/18 2351        Medications:  Scheduled: . enoxaparin (LOVENOX) injection  40 mg Subcutaneous Q24H  . gabapentin  200 mg Oral BID  . insulin aspart  0-15 Units Subcutaneous TID WC  . insulin aspart  0-5 Units Subcutaneous QHS  . insulin aspart  5 Units Subcutaneous TID WC  . insulin  detemir  40 Units Subcutaneous QHS  . lisinopril  5 mg Oral Daily  . pantoprazole  40 mg Oral Daily  . senna-docusate  1 tablet Oral BID    Objective: Vital signs in last 24 hours: Temp:  [98.3 F (36.8 C)-98.9 F (37.2 C)] 98.5 F (36.9 C) (05/01 1113) Pulse Rate:  [89-100] 95 (05/01 1113) Resp:  [18-20] 18 (05/01 1113) BP: (160-186)/(100-115) 173/101 (05/01 1113) SpO2:  [97 %-99 %] 98 % (05/01 1113) Weight:  [84.8 kg (186 lb 14.4 oz)] 84.8 kg (186 lb 14.4 oz) (05/01 0357)   General appearance: alert, cooperative, fatigued and no distress Resp: clear to auscultation bilaterally Cardio: regular rate and rhythm GI: normal findings: bowel sounds normal and soft, non-tender  Lab Results Recent Labs    02/16/18 1642 02/17/18 0340  WBC 10.9* 10.8*  HGB 11.0* 10.6*  HCT 32.5* 31.2*  NA 130* 134*  K 4.7 4.4  CL 95* 95*  CO2 29 30  BUN 6 8  CREATININE 0.72 0.68   Liver Panel No results for input(s): PROT, ALBUMIN, AST, ALT, ALKPHOS, BILITOT, BILIDIR, IBILI in the last 72 hours. Sedimentation Rate No results for input(s): ESRSEDRATE in the last 72 hours. C-Reactive Protein No results for input(s): CRP in the last 72 hours.  Microbiology: Recent  Results (from the past 240 hour(s))  Culture, blood (routine x 2)     Status: None (Preliminary result)   Collection Time: 02/14/18  2:27 PM  Result Value Ref Range Status   Specimen Description BLOOD FOOT RIGHT  Final   Special Requests   Final    AEROBIC BOTTLE ONLY Blood Culture results may not be optimal due to an inadequate volume of blood received in culture bottles   Culture   Final    NO GROWTH 3 DAYS Performed at [facility], [address], [place], [place] [address]    Report Status PENDING  Incomplete  Culture, blood (routine x 2)     Status: None (Preliminary result)   Collection Time: 02/14/18  2:27 PM  Result Value Ref Range Status   Specimen Description BLOOD RIGHT FOOT  Final   Special Requests    Final    AEROBIC BOTTLE ONLY Blood Culture results may not be optimal due to an inadequate volume of blood received in culture bottles   Culture   Final    NO GROWTH 3 DAYS Performed at [facility], [address], [place], [place] [address]    Report Status PENDING  Incomplete    Studies/Results: Mr Femur Left W Wo Contrast  Result Date: 02/17/2018 CLINICAL DATA:  Septic joints.  Yeast infection of the left knee. EXAM: MR OF THE LEFT LOWER EXTREMITY WITHOUT AND WITH CONTRAST TECHNIQUE: Multiplanar, multisequence MR imaging of the left thigh was performed both before and after administration of intravenous contrast. CONTRAST:  52mL MULTIHANCE GADOBENATE DIMEGLUMINE 529 MG/ML IV SOLN COMPARISON:  MRI of the left knee 02/14/2018 FINDINGS: Muscles and Tendons There is extensive edema in and around the muscles of the left thigh, most prominently involving the vastus intermedius and vastus medialis muscles. Edema extends from the level of the hip joint distally to the knee. After contrast administration there is an area of abnormal decreased enhancement in the vastus intermedius muscle best seen on image 35 of series 9 and on image 41 of series 9 which I suspect represents myonecrosis or intramuscular abscess secondary to the patient's fungal infection. Bones/Joint/Cartilage The visualized pelvic bones and the visualized portion of the left femur appear normal. Soft tissues Extensive subcutaneous edema in the left thigh with edema surrounding most of the muscles of the thigh. IMPRESSION: 1. Myositis involving the muscles of the left thigh, most prominently in the vastus intermedius and vastus medialis muscles. 2. Focal area of either intramuscular abscess or myonecrosis of the vastus intermedius muscle. Electronically Signed   By: Francene Boyers M.D.   On: 02/17/2018 11:37     Assessment/Plan: Endocarditis (MV and Ao) Fungemia  C parapsilosis MIC flucon 1 Actinomyces bacteremia L  hand septic arthritis (debrided 4-19) Group C strep L knee septic arthritis Yeast on aspirate IVDA (cocaine)  Active last 2 weeks ago Hypertension Dental carries, no abscess IDDM   Total days of antibiotics: 10 (caspo --> flucon, PEN)  Repeat TEE on 5-3 CVTS has seen Continue flucon and PEN Hepatitis C studies pending.           Johny Sax MD, FACP Infectious Diseases (pager) [phone] [email or web address] 02/17/2018, 4:00 PM  LOS: 5 days
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
## 2018-02-17 NOTE — Progress Notes (Signed)
Inpatient Diabetes Program Recommendations  AACE/ADA: New Consensus Statement on Inpatient Glycemic Control (2015)  Target Ranges:  Prepandial:   less than 140 mg/dL      Peak postprandial:   less than 180 mg/dL (1-2 hours)      Critically ill patients:  140 - 180 mg/dL   Review of Glycemic Control  Results for WELBORN, KEENA (MRN [id number]) as of 02/17/2018 11:25  Ref. Range 02/16/2018 07:38 02/16/2018 12:17 02/16/2018 16:20 02/16/2018 21:14 02/17/2018 08:00  Glucose-Capillary Latest Ref Range: 65 - 99 mg/dL 935 (H) 701 (H) 779 (H) 316 (H) 215 (H)   Diabetes history: DM2 Outpatient Diabetes medications: Levemir 80 units qd Current orders for Inpatient glycemic control: Levemir 40 units qd + Novolog 5 units tid meal coverage + Novolog moderate correction scale tid + Novolog HS scale  Inpatient Diabetes Program Recommendations:   Glucose lower this am but still elevated at 215 mg/dl. Please consider increasing Levemir to 45 units.  Thank you, Christena Deem RN, MSN, BC-ADM, [facility] Inpatient Diabetes Coordinator Team Pager [phone] (8a-5p)
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
## 2018-02-17 NOTE — Progress Notes (Signed)
Pt refusing to take HTN meds. I explained the danger of untreated HTN.

## 2018-02-17 NOTE — Progress Notes (Signed)
Patient refused Heart healthy diet.  Wants regular diet- Paged MD PA oked regular diet
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
## 2018-02-17 NOTE — Progress Notes (Addendum)
Discussed with Dr. Delton See, plan to TEE with anesthesia tomorrow. NPO past mid night  Derek Mitchell [name] PA Pager: [phone]   Addendum: unable to get anesthesia until Friday. Will tentative place him on TEE with anesthesia for Friday

## 2018-02-18 DIAGNOSIS — R7881 Bacteremia: Secondary | ICD-10-CM

## 2018-02-18 LAB — GLUCOSE, CAPILLARY
GLUCOSE-CAPILLARY: 203 mg/dL — AB (ref 65–99)
Glucose-Capillary: 246 mg/dL — ABNORMAL HIGH (ref 65–99)
Glucose-Capillary: 295 mg/dL — ABNORMAL HIGH (ref 65–99)

## 2018-02-18 LAB — HEPATITIS C VRS RNA DETECT BY PCR-QUAL: HEPATITIS C VRS RNA BY PCR-QUAL: POSITIVE — AB

## 2018-02-18 MED ORDER — KETOROLAC TROMETHAMINE 30 MG/ML IJ SOLN
30.0000 mg | Freq: Four times a day (QID) | INTRAMUSCULAR | Status: AC | PRN
  Administered 2018-02-18 – 2018-02-19 (×3): 30 mg via INTRAVENOUS
  Filled 2018-02-18 (×3): qty 1

## 2018-02-18 NOTE — Progress Notes (Signed)
INFECTIOUS DISEASE PROGRESS NOTE  ID: [facility] is a 47 y.o. 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   Abtx:  Anti-infectives (From admission, onward)   Start     Dose/Rate Route Frequency Ordered Stop   02/16/18 2200  fluconazole (DIFLUCAN) IVPB 400 mg     400 mg 100 mL/hr over 120 Minutes Intravenous Every 24 hours 02/15/18 0955     02/15/18 2200  anidulafungin (ERAXIS) 200 mg in sodium chloride 0.9 % 200 mL IVPB  Status:  Discontinued     200 mg 78 mL/hr over 200 Minutes Intravenous Daily at bedtime 02/15/18 0843 02/15/18 0955   02/15/18 2200  fluconazole (DIFLUCAN) IVPB 800 mg     800 mg 200 mL/hr over 120 Minutes Intravenous  Once 02/15/18 0955 02/16/18 0144   02/13/18 0000  anidulafungin (ERAXIS) 100 mg in sodium chloride 0.9 % 100 mL IVPB  Status:  Discontinued     100 mg 78 mL/hr over 100 Minutes Intravenous Daily at bedtime 02/12/18 2306 02/15/18 0843   02/13/18 0000  penicillin G potassium injection 4 Million Units  Status:  Discontinued     4 Million Units Intramuscular Every 4 hours 02/12/18 2306 02/12/18 2351   02/13/18 0000  penicillin G potassium 4 Million Units in dextrose 5 % 250 mL IVPB     4 Million Units 250 mL/hr over 60 Minutes Intravenous Every 4 hours 02/12/18 2351        Medications:  Scheduled: . enoxaparin (LOVENOX) injection  40 mg Subcutaneous Q24H  . gabapentin  200 mg Oral BID  . insulin aspart  0-15 Units Subcutaneous TID WC  . insulin aspart  0-5 Units Subcutaneous QHS  . insulin aspart  5 Units Subcutaneous TID WC  . insulin detemir  40 Units  Subcutaneous QHS  . lisinopril  5 mg Oral Daily  . pantoprazole  40 mg Oral Daily  . senna-docusate  1 tablet Oral BID    Objective: Vital signs in last 24 hours: Temp:  [98.8 F (37.1 C)-100.6 F (38.1 C)] 98.8 F (37.1 C) (05/02 0437) Pulse Rate:  [87-106] 92 (05/02 0839) Resp:  [14-20] 14 (05/02 0839) BP: (167-180)/(103-111) 167/103 (05/02 0839) SpO2:  [97 %-98 %] 98 % (05/02 0437) Weight:  [83.7 kg (184 lb 9.6 oz)] 83.7 kg (184 lb 9.6 oz) (05/02 0437)   General appearance: alert and no distress Resp: clear to auscultation bilaterally Cardio: regular rate and rhythm GI: normal findings: bowel sounds normal and soft, non-tender Extremities: LLE knee wrapped.   Lab Results Recent Labs    02/16/18 1642 02/17/18 0340  WBC 10.9* 10.8*  HGB 11.0* 10.6*  HCT 32.5* 31.2*  NA 130* 134*  K 4.7 4.4  CL 95* 95*  CO2 29 30  BUN 6 8  CREATININE 0.72 0.68   Liver Panel No results for input(s): PROT, ALBUMIN, AST, ALT, ALKPHOS, BILITOT, BILIDIR, IBILI in the last 72 hours. Sedimentation Rate No results for input(s): ESRSEDRATE in the last 72 hours. C-Reactive Protein No results for input(s): CRP in the last 72 hours.  Microbiology:  Recent Results (from the past 240 hour(s))  Culture, blood (routine x 2)     Status: None (Preliminary result)   Collection Time: 02/14/18  2:27 PM  Result Value Ref Range Status   Specimen Description BLOOD FOOT RIGHT  Final   Special Requests   Final    AEROBIC BOTTLE ONLY Blood Culture results may not be optimal due to an inadequate volume of blood received in culture bottles   Culture   Final    NO GROWTH 3 DAYS Performed at [facility], [address], [place], [place] [address]    Report Status PENDING  Incomplete  Culture, blood (routine x 2)     Status: None (Preliminary result)   Collection Time: 02/14/18  2:27 PM  Result Value Ref Range Status   Specimen Description BLOOD RIGHT FOOT  Final   Special Requests   Final     AEROBIC BOTTLE ONLY Blood Culture results may not be optimal due to an inadequate volume of blood received in culture bottles   Culture   Final    NO GROWTH 3 DAYS Performed at [facility], [address] [place], [place] [address]    Report Status PENDING  Incomplete    Studies/Results: Mr Femur Left W Wo Contrast  Result Date: 02/17/2018 CLINICAL DATA:  Septic joints.  Yeast infection of the left knee. EXAM: MR OF THE LEFT LOWER EXTREMITY WITHOUT AND WITH CONTRAST TECHNIQUE: Multiplanar, multisequence MR imaging of the left thigh was performed both before and after administration of intravenous contrast. CONTRAST:  61mL MULTIHANCE GADOBENATE DIMEGLUMINE 529 MG/ML IV SOLN COMPARISON:  MRI of the left knee 02/14/2018 FINDINGS: Muscles and Tendons There is extensive edema in and around the muscles of the left thigh, most prominently involving the vastus intermedius and vastus medialis muscles. Edema extends from the level of the hip joint distally to the knee. After contrast administration there is an area of abnormal decreased enhancement in the vastus intermedius muscle best seen on image 35 of series 9 and on image 41 of series 9 which I suspect represents myonecrosis or intramuscular abscess secondary to the patient's fungal infection. Bones/Joint/Cartilage The visualized pelvic bones and the visualized portion of the left femur appear normal. Soft tissues Extensive subcutaneous edema in the left thigh with edema surrounding most of the muscles of the thigh. IMPRESSION: 1. Myositis involving the muscles of the left thigh, most prominently in the vastus intermedius and vastus medialis muscles. 2. Focal area of either intramuscular abscess or myonecrosis of the vastus intermedius muscle. Electronically Signed   By: Francene Boyers M.D.   On: 02/17/2018 11:37     Assessment/Plan: Endocarditis (MV and Ao) Fungemia  C parapsilosis MIC flucon 1 Actinomyces bacteremia L hand septic  arthritis (debrided 4-19) Group C strep L knee septic arthritis Yeast on aspirate IVDA (cocaine)  Active last 2 weeks Hypertension Dental carries, no abscess IDDM   Total days of antibiotics:10 (caspo --> flucon, PEN)  Repeat TEE on 5-3 CVTS has seen Continue flucon and PEN Hepatitis C studies pending. Pt refusing HTN rx.            Johny Sax MD, FACP Infectious Diseases (pager) [phone] [email or web address] 02/18/2018, 11:18 AM  LOS: 6 days
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
## 2018-02-18 NOTE — Progress Notes (Signed)
Refused blood pressure med, explained the danger of having high BP but still refused.Pt stated " I don't want it, I know my BP has always been high".

## 2018-02-19 ENCOUNTER — Encounter (HOSPITAL_COMMUNITY): Payer: Self-pay | Admitting: Cardiovascular Disease

## 2018-02-19 ENCOUNTER — Encounter (HOSPITAL_COMMUNITY)
Admission: AD | Disposition: A | Discharge status: Home / Self Care | Payer: Self-pay | Source: Other Acute Inpatient Hospital | Attending: Internal Medicine

## 2018-02-19 ENCOUNTER — Inpatient Hospital Stay (HOSPITAL_COMMUNITY): Payer: Medicare HMO | Admitting: Certified Registered Nurse Anesthetist

## 2018-02-19 ENCOUNTER — Inpatient Hospital Stay (HOSPITAL_COMMUNITY): Payer: Medicare HMO

## 2018-02-19 DIAGNOSIS — I339 Acute and subacute endocarditis, unspecified: Secondary | ICD-10-CM

## 2018-02-19 HISTORY — PX: TEE WITHOUT CARDIOVERSION: SHX5443

## 2018-02-19 LAB — CBC
HCT: 28.5 % — ABNORMAL LOW (ref 39.0–52.0)
Hemoglobin: 9.5 g/dL — ABNORMAL LOW (ref 13.0–17.0)
MCH: 28.4 pg (ref 26.0–34.0)
MCHC: 33.3 g/dL (ref 30.0–36.0)
MCV: 85.3 fL (ref 78.0–100.0)
PLATELETS: 292 10*3/uL (ref 150–400)
RBC: 3.34 MIL/uL — AB (ref 4.22–5.81)
RDW: 13.6 % (ref 11.5–15.5)
WBC: 4.8 10*3/uL (ref 4.0–10.5)

## 2018-02-19 LAB — MAGNESIUM: Magnesium: 1.7 mg/dL (ref 1.7–2.4)

## 2018-02-19 LAB — GLUCOSE, CAPILLARY
GLUCOSE-CAPILLARY: 186 mg/dL — AB (ref 65–99)
GLUCOSE-CAPILLARY: 330 mg/dL — AB (ref 65–99)
GLUCOSE-CAPILLARY: 313 mg/dL — AB (ref 65–99)
Glucose-Capillary: 195 mg/dL — ABNORMAL HIGH (ref 65–99)

## 2018-02-19 LAB — CULTURE, BLOOD (ROUTINE X 2)
CULTURE: NO GROWTH
Culture: NO GROWTH

## 2018-02-19 LAB — BASIC METABOLIC PANEL
ANION GAP: 8 (ref 5–15)
BUN: 9 mg/dL (ref 6–20)
CALCIUM: 8.7 mg/dL — AB (ref 8.9–10.3)
CO2: 28 mmol/L (ref 22–32)
Chloride: 97 mmol/L — ABNORMAL LOW (ref 101–111)
Creatinine, Ser: 0.84 mg/dL (ref 0.61–1.24)
Glucose, Bld: 350 mg/dL — ABNORMAL HIGH (ref 65–99)
Potassium: 4.6 mmol/L (ref 3.5–5.1)
Sodium: 133 mmol/L — ABNORMAL LOW (ref 135–145)

## 2018-02-19 LAB — HEPATITIS C GENOTYPE: HCV Genotype: 3

## 2018-02-19 SURGERY — ECHOCARDIOGRAM, TRANSESOPHAGEAL
Anesthesia: Monitor Anesthesia Care

## 2018-02-19 MED ORDER — TRAZODONE HCL 50 MG PO TABS: 50.0000 mg | ORAL_TABLET | Freq: Every evening | ORAL | Status: DC | PRN

## 2018-02-19 MED ORDER — PROPOFOL 500 MG/50ML IV EMUL
INTRAVENOUS | Status: DC | PRN
  Administered 2018-02-19: 200 ug/kg/min via INTRAVENOUS

## 2018-02-19 MED ORDER — DILTIAZEM HCL 30 MG PO TABS
30.0000 mg | ORAL_TABLET | Freq: Four times a day (QID) | ORAL | Status: DC
  Administered 2018-02-19 – 2018-02-20 (×4): 30 mg via ORAL
  Filled 2018-02-19 (×4): qty 1

## 2018-02-19 MED ORDER — BUTAMBEN-TETRACAINE-BENZOCAINE 2-2-14 % EX AERO
INHALATION_SPRAY | CUTANEOUS | Status: DC | PRN
  Administered 2018-02-19: 2 via TOPICAL

## 2018-02-19 MED ORDER — PROPOFOL 10 MG/ML IV BOLUS
INTRAVENOUS | Status: DC | PRN
  Administered 2018-02-19: 20 mg via INTRAVENOUS
  Administered 2018-02-19: 30 mg via INTRAVENOUS

## 2018-02-19 MED ORDER — SODIUM CHLORIDE 0.9 % IV SOLN
INTRAVENOUS | Status: DC
  Administered 2018-02-19: 10:00:00 via INTRAVENOUS

## 2018-02-19 MED ORDER — GLYCOPYRROLATE 0.2 MG/ML IV SOSY
PREFILLED_SYRINGE | INTRAVENOUS | Status: DC | PRN
  Administered 2018-02-19: .2 mg via INTRAVENOUS

## 2018-02-19 NOTE — Progress Notes (Signed)
MD, pt may benefit from some kind of anxiety med or sleep aid, to help, thanks Glenna Fellows.

## 2018-02-19 NOTE — Anesthesia Preprocedure Evaluation (Signed)
Anesthesia Evaluation  Patient identified by MRN, date of birth, ID band Patient awake    Reviewed: Allergy & Precautions, NPO status , Patient's Chart, lab work & pertinent test results  Airway Mallampati: II  TM Distance: >3 FB Neck ROM: Full    Dental no notable dental hx.    Pulmonary neg pulmonary ROS,    Pulmonary exam normal breath sounds clear to auscultation       Cardiovascular negative cardio ROS Normal cardiovascular exam Rhythm:Regular Rate:Normal     Neuro/Psych negative neurological ROS  negative psych ROS   GI/Hepatic negative GI ROS, (+)     substance abuse  IV drug use,   Endo/Other  diabetes  Renal/GU negative Renal ROS  negative genitourinary   Musculoskeletal negative musculoskeletal ROS (+)   Abdominal   Peds negative pediatric ROS (+)  Hematology  (+) anemia ,   Anesthesia Other Findings   Reproductive/Obstetrics negative OB ROS                             Anesthesia Physical Anesthesia Plan  ASA: III  Anesthesia Plan: MAC   Post-op Pain Management:    Induction: Intravenous  PONV Risk Score and Plan: 0  Airway Management Planned: Nasal Cannula  Additional Equipment:   Intra-op Plan:   Post-operative Plan:   Informed Consent: I have reviewed the patients History and Physical, chart, labs and discussed the procedure including the risks, benefits and alternatives for the proposed anesthesia with the patient or authorized representative who has indicated his/her understanding and acceptance.   Dental advisory given  Plan Discussed with: CRNA and Surgeon  Anesthesia Plan Comments:         Anesthesia Quick Evaluation

## 2018-02-19 NOTE — Interval H&P Note (Signed)
History and Physical Interval Note:  02/19/2018 9:10 AM  [facility]  has presented today for surgery, with the diagnosis of BACTEREMIA  The various methods of treatment have been discussed with the patient and family. After consideration of risks, benefits and other options for treatment, the patient has consented to  Procedure(s): TRANSESOPHAGEAL ECHOCARDIOGRAM (TEE) (N/A) as a surgical intervention .  The patient's history has been reviewed, patient examined, no change in status, stable for surgery.  I have reviewed the patient's chart and labs.  Questions were answered to the patient's satisfaction.     Chilton Si, MD

## 2018-02-19 NOTE — Transfer of Care (Signed)
Immediate Anesthesia Transfer of Care Note  Patient: [facility]  Procedure(s) Performed: TRANSESOPHAGEAL ECHOCARDIOGRAM (TEE) (N/A )  Patient Location: Endoscopy Unit  Anesthesia Type:MAC  Level of Consciousness: drowsy  Airway & Oxygen Therapy: Patient Spontanous Breathing and Patient connected to nasal cannula oxygen  Post-op Assessment: Report given to RN and Post -op Vital signs reviewed and stable  Post vital signs: Reviewed and stable  Last Vitals:  Vitals Value Taken Time  BP 130/92 02/19/2018 10:56 AM  Temp    Pulse 106 02/19/2018 10:58 AM  Resp 19 02/19/2018 10:58 AM  SpO2 99 % 02/19/2018 10:58 AM  Vitals shown include unvalidated device data.  Last Pain:  Vitals:   02/19/18 1055  TempSrc: Oral  PainSc:       Patients Stated Pain Goal: 2 (17/91/50 5697)  Complications: No apparent anesthesia complications

## 2018-02-19 NOTE — Anesthesia Postprocedure Evaluation (Signed)
Anesthesia Post Note  Patient: [facility]  Procedure(s) Performed: TRANSESOPHAGEAL ECHOCARDIOGRAM (TEE) (N/A )     Patient location during evaluation: PACU Anesthesia Type: MAC Level of consciousness: awake and alert Pain management: pain level controlled Vital Signs Assessment: post-procedure vital signs reviewed and stable Respiratory status: spontaneous breathing, nonlabored ventilation, respiratory function stable and patient connected to nasal cannula oxygen Cardiovascular status: stable and blood pressure returned to baseline Postop Assessment: no apparent nausea or vomiting Anesthetic complications: no    Last Vitals:  Vitals:   02/19/18 1055 02/19/18 1227  BP: (!) 130/92 (!) 166/107  Pulse: (!) 108 98  Resp: 19 20  Temp:  37.3 C  SpO2: 100% 95%    Last Pain:  Vitals:   02/19/18 1227  TempSrc: Oral  PainSc:                  Annebelle Bostic S

## 2018-02-19 NOTE — Progress Notes (Signed)
Pt stable but in a lot of pain all the time and around the clock pain medicines given throughout the time, IVABX continue, TEE done and pt started regular diet, will continue to monitor the patient.  Lonia Farber, RN

## 2018-02-19 NOTE — CV Procedure (Signed)
Brief TEE Note  LVEF 60-65% Trivial MR and TR No LA/LAA thrombus or mass No evidence of endocarditis.  For additional details see full report.  [facility] C. Duke Salvia, MD, [facility] 02/19/2018 10:50 AM
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
## 2018-02-19 NOTE — Progress Notes (Signed)
  Echocardiogram Echocardiogram Transesophageal has been performed.  Derek Mitchell 02/19/2018, 10:54 AM

## 2018-02-19 NOTE — Progress Notes (Signed)
INFECTIOUS DISEASE PROGRESS NOTE  ID: Derek Mitchell is a 47 y.o. 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   Group C streptococcal infection   Aortic valve endocarditis   Arthritis, septic, knee (HCC)   Fungal arthritis (HCC)   Essential hypertension  Subjective: No complaints  Abtx:  Anti-infectives (From admission, onward)   Start     Dose/Rate Route Frequency Ordered Stop   02/16/18 2200  fluconazole (DIFLUCAN) IVPB 400 mg     400 mg 100 mL/hr over 120 Minutes Intravenous Every 24 hours 02/15/18 0955     02/15/18 2200  anidulafungin (ERAXIS) 200 mg in sodium chloride 0.9 % 200 mL IVPB  Status:  Discontinued     200 mg 78 mL/hr over 200 Minutes Intravenous Daily at bedtime 02/15/18 0843 02/15/18 0955   02/15/18 2200  fluconazole (DIFLUCAN) IVPB 800 mg     800 mg 200 mL/hr over 120 Minutes Intravenous  Once 02/15/18 0955 02/16/18 0144   02/13/18 0000  anidulafungin (ERAXIS) 100 mg in sodium chloride 0.9 % 100 mL IVPB  Status:  Discontinued     100 mg 78 mL/hr over 100 Minutes Intravenous Daily at bedtime 02/12/18 2306 02/15/18 0843   02/13/18 0000  penicillin G potassium injection 4 Million Units  Status:  Discontinued     4 Million Units Intramuscular Every 4 hours 02/12/18 2306 02/12/18 2351   02/13/18 0000  penicillin G potassium 4 Million Units in dextrose 5 % 250 mL IVPB     4 Million Units 250 mL/hr over 60 Minutes Intravenous Every 4 hours 02/12/18 2351        Medications:  Scheduled: . diltiazem  30 mg Oral Q6H  . enoxaparin (LOVENOX) injection  40 mg Subcutaneous Q24H  . gabapentin  200 mg Oral BID  . insulin aspart  0-15 Units Subcutaneous TID WC  . insulin aspart  0-5 Units Subcutaneous QHS  . insulin aspart  5 Units Subcutaneous TID WC  . insulin  detemir  40 Units Subcutaneous QHS  . lisinopril  5 mg Oral Daily  . pantoprazole  40 mg Oral Daily  . senna-docusate  1 tablet Oral BID    Objective: Vital signs in last 24 hours: Temp:  [98.3 F (36.8 C)-99.1 F (37.3 C)] 99.1 F (37.3 C) (05/03 1227) Pulse Rate:  [84-108] 98 (05/03 1227) Resp:  [13-20] 20 (05/03 1227) BP: (130-178)/(92-107) 166/107 (05/03 1227) SpO2:  [95 %-100 %] 95 % (05/03 1227) Weight:  [84.1 kg (185 lb 4.8 oz)] 84.1 kg (185 lb 4.8 oz) (05/03 0318)   General appearance: alert, cooperative and no distress Resp: clear to auscultation bilaterally Cardio: regular rate and rhythm GI: normal findings: bowel sounds normal and soft, non-tender Extremities: L knee wrapped.   Lab Results Recent Labs    02/16/18 1642 02/17/18 0340  WBC 10.9* 10.8*  HGB 11.0* 10.6*  HCT 32.5* 31.2*  NA 130* 134*  K 4.7 4.4  CL 95* 95*  CO2 29 30  BUN 6 8  CREATININE 0.72 0.68   Liver Panel Recent Labs    02/16/18 1642  ALT PLASMA   Sedimentation Rate No results for input(s): ESRSEDRATE in the last 72 hours. C-Reactive Protein No results for input(s): CRP in the last 72 hours.  Microbiology: Recent Results (from the past 240 hour(s))  Culture, blood (routine x 2)     Status: None (Preliminary result)   Collection Time: 02/14/18  2:27 PM  Result Value Ref Range Status   Specimen Description BLOOD FOOT RIGHT  Final   Special Requests   Final    AEROBIC BOTTLE ONLY Blood Culture results may not be optimal due to an inadequate volume of blood received in culture bottles   Culture   Final    NO GROWTH 4 DAYS Performed at [facility], [address], [place], [place] [address]    Report Status PENDING  Incomplete  Culture, blood (routine x 2)     Status: None (Preliminary result)   Collection Time: 02/14/18  2:27 PM  Result Value Ref Range Status   Specimen Description BLOOD RIGHT FOOT  Final   Special Requests   Final    AEROBIC BOTTLE ONLY Blood  Culture results may not be optimal due to an inadequate volume of blood received in culture bottles   Culture   Final    NO GROWTH 4 DAYS Performed at [facility], [address], [place], [place] [address]    Report Status PENDING  Incomplete    Studies/Results: No results found.   Assessment/Plan: Endocarditis (MV and Ao) Fungemia  C parapsilosis MIC flucon 1 Actinomyces bacteremia L hand septic arthritis (debrided 4-19) Group C strep L knee septic arthritis Yeast on aspirate IVDA (cocaine)  Active last 2 weeks Hypertension Dental carries, no abscess IDDM   Total days of antibiotics:11(caspo -->flucon, PEN)  His repeat TEE is (-) Repeat BCx 4-28 (-) He has a sensitive Candida and a sensitive Strep.  I would favor giving him IV fluconazole and PEN til time of d/c.  Would then change him to amoxil 500mg  tid and fluconazole 400mg  po daily for at least 6 weeks after d/c.   Available as needed.          MD, FACP Infectious Diseases (pager) [phone] [email or web address] 02/19/2018, 2:21 PM  LOS: 7 days

## 2018-02-20 LAB — CBC
HCT: 29.2 % — ABNORMAL LOW (ref 39.0–52.0)
HEMOGLOBIN: 9.9 g/dL — AB (ref 13.0–17.0)
MCH: 28.9 pg (ref 26.0–34.0)
MCHC: 33.9 g/dL (ref 30.0–36.0)
MCV: 85.1 fL (ref 78.0–100.0)
PLATELETS: 332 10*3/uL (ref 150–400)
RBC: 3.43 MIL/uL — AB (ref 4.22–5.81)
RDW: 13.7 % (ref 11.5–15.5)
WBC: 6.6 10*3/uL (ref 4.0–10.5)

## 2018-02-20 LAB — BASIC METABOLIC PANEL
ANION GAP: 6 (ref 5–15)
BUN: 8 mg/dL (ref 6–20)
CALCIUM: 7.2 mg/dL — AB (ref 8.9–10.3)
CO2: 24 mmol/L (ref 22–32)
Chloride: 105 mmol/L (ref 101–111)
Creatinine, Ser: 0.55 mg/dL — ABNORMAL LOW (ref 0.61–1.24)
GFR calc Af Amer: >60 mL/min (ref >60)
Glucose, Bld: 168 mg/dL — ABNORMAL HIGH (ref 65–99)
POTASSIUM: 3.6 mmol/L (ref 3.5–5.1)
SODIUM: 135 mmol/L (ref 135–145)

## 2018-02-20 LAB — GLUCOSE, CAPILLARY: Glucose-Capillary: 212 mg/dL — ABNORMAL HIGH (ref 65–99)

## 2018-02-20 LAB — MAGNESIUM: MAGNESIUM: 1.3 mg/dL — AB (ref 1.7–2.4)

## 2018-02-20 MED ORDER — LISINOPRIL 5 MG PO TABS: 5.0000 mg | ORAL_TABLET | Freq: Every day | ORAL | 0 refills | Status: AC

## 2018-02-20 MED ORDER — PANTOPRAZOLE SODIUM 40 MG PO TBEC: 40.0000 mg | DELAYED_RELEASE_TABLET | Freq: Every day | ORAL | 0 refills | Status: DC

## 2018-02-20 MED ORDER — OXYCODONE HCL 15 MG PO TABS: 15.0000 mg | ORAL_TABLET | Freq: Four times a day (QID) | ORAL | 0 refills | Status: AC | PRN

## 2018-02-20 MED ORDER — AMOXICILLIN 500 MG PO CAPS: 500.0000 mg | ORAL_CAPSULE | Freq: Three times a day (TID) | ORAL | 0 refills | Status: AC

## 2018-02-20 MED ORDER — INSULIN DETEMIR 100 UNIT/ML ~~LOC~~ SOLN: 40.0000 [IU] | Freq: Every day | SUBCUTANEOUS | 0 refills | Status: AC

## 2018-02-20 MED ORDER — FLUCONAZOLE 200 MG PO TABS: 400.0000 mg | ORAL_TABLET | Freq: Every day | ORAL | 0 refills | Status: AC

## 2018-02-20 MED ORDER — SENNOSIDES-DOCUSATE SODIUM 8.6-50 MG PO TABS: 1.0000 | ORAL_TABLET | Freq: Every evening | ORAL | 0 refills | Status: DC | PRN

## 2018-02-20 MED ORDER — GABAPENTIN 100 MG PO CAPS: 200.0000 mg | ORAL_CAPSULE | Freq: Two times a day (BID) | ORAL | 0 refills | Status: DC

## 2018-02-20 MED ORDER — POLYETHYLENE GLYCOL 3350 17 G PO PACK: 17.0000 g | PACK | Freq: Every day | ORAL | 0 refills | Status: DC | PRN

## 2018-02-20 MED ORDER — INSULIN ASPART 100 UNIT/ML ~~LOC~~ SOLN: 5.0000 [IU] | Freq: Three times a day (TID) | SUBCUTANEOUS | 0 refills | Status: DC

## 2018-02-20 MED ORDER — TRAZODONE HCL 50 MG PO TABS: 50.0000 mg | ORAL_TABLET | Freq: Every evening | ORAL | 0 refills | Status: DC | PRN

## 2018-02-20 MED ORDER — DILTIAZEM HCL ER COATED BEADS 120 MG PO CP24: 120.0000 mg | ORAL_CAPSULE | Freq: Every day | ORAL | 0 refills | Status: AC
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
## 2018-02-20 NOTE — Progress Notes (Signed)
Reviewed discharge instructions/medications with patient and patient's mom. Pt was refusing to let me take his blood pressure again to see if it went down. Understands he needs to start taking the medications prescribed to him. Patient in the process of changing his primary care Doctor. Will make an appointment as soon as possible. Answered patient's questions. Patient did not want to wait on front wheel walker to be delivered to his room. Contacted Steward Drone, Case Manager so she could let [facility] know.

## 2018-02-20 NOTE — Progress Notes (Addendum)
Patient discharging home today on po antibiotics; Rolling walker ordered through [facility] and to be delivered to the room today prior to discharging; McLeod with [facility] called; Alexis Goodell [phone]  12:30 pm Received call from [facility], patient left for home and did not want to wait any longer for his walker; Jermane with [facility] called, he will deliver the walker to his home; Alexis Goodell [phone]

## 2018-02-20 NOTE — Progress Notes (Signed)
Contacted case manger to get a rolling walker ordered for patient.

## 2018-02-20 NOTE — Progress Notes (Signed)
Changed patient's dressing on left hand. Cleansed with NS, placed 2x2 and 4x4 gauze. Wrapped with kerlex. Patient tolerated well.
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
## 2018-02-20 NOTE — Progress Notes (Signed)
Patient is refusing to take lisinopril. BP is elevated. Notified Dr. Nelson Chimes.

## 2018-02-20 NOTE — Plan of Care (Signed)
  Problem: Elimination: Goal: Will not experience complications related to urinary retention Outcome: Progressing   Problem: Skin Integrity: Goal: Risk for impaired skin integrity will decrease Outcome: Progressing   

## 2018-02-21 ENCOUNTER — Encounter (HOSPITAL_COMMUNITY): Payer: Self-pay | Admitting: Cardiovascular Disease

## 2019-06-11 DIAGNOSIS — E871 Hypo-osmolality and hyponatremia: Secondary | ICD-10-CM

## 2019-06-11 DIAGNOSIS — I10 Essential (primary) hypertension: Secondary | ICD-10-CM

## 2019-06-11 DIAGNOSIS — M60061 Infective myositis, right lower leg: Secondary | ICD-10-CM

## 2019-06-11 DIAGNOSIS — E440 Moderate protein-calorie malnutrition: Secondary | ICD-10-CM

## 2019-06-11 DIAGNOSIS — M1990 Unspecified osteoarthritis, unspecified site: Secondary | ICD-10-CM

## 2019-06-11 DIAGNOSIS — M79604 Pain in right leg: Secondary | ICD-10-CM

## 2019-06-26 DIAGNOSIS — L03115 Cellulitis of right lower limb: Secondary | ICD-10-CM

## 2019-06-26 DIAGNOSIS — L02415 Cutaneous abscess of right lower limb: Secondary | ICD-10-CM

## 2019-06-26 DIAGNOSIS — I96 Gangrene, not elsewhere classified: Secondary | ICD-10-CM

## 2019-06-26 DIAGNOSIS — D638 Anemia in other chronic diseases classified elsewhere: Secondary | ICD-10-CM

## 2019-06-26 DIAGNOSIS — E1165 Type 2 diabetes mellitus with hyperglycemia: Secondary | ICD-10-CM

## 2019-06-26 DIAGNOSIS — M60061 Infective myositis, right lower leg: Secondary | ICD-10-CM

## 2019-06-26 DIAGNOSIS — E871 Hypo-osmolality and hyponatremia: Secondary | ICD-10-CM

## 2019-07-10 IMAGING — CT CT MAXILLOFACIAL W/ CM
3 series · 15 of 47 positions shown, 18 images · IV contrast (Omni 300)
Comparison: None.

CLINICAL DATA: IV drug abuser. Evaluate dentition. Fungal
endocarditis.

EXAM:
CT MAXILLOFACIAL WITH CONTRAST
TECHNIQUE: Multidetector CT imaging of the maxillofacial structures was
performed with intravenous contrast. Multiplanar CT image
reconstructions were also generated.
CONTRAST:  75mL VLJMW1-ZII IOPAMIDOL (VLJMW1-ZII) INJECTION 61%

[Series 3: facialbone 2.0 st · axial · 0.31mm/px · z∈[-137,+11]mm · 9 of 86 slices shown, 12 images]
[im 6/86  brain]
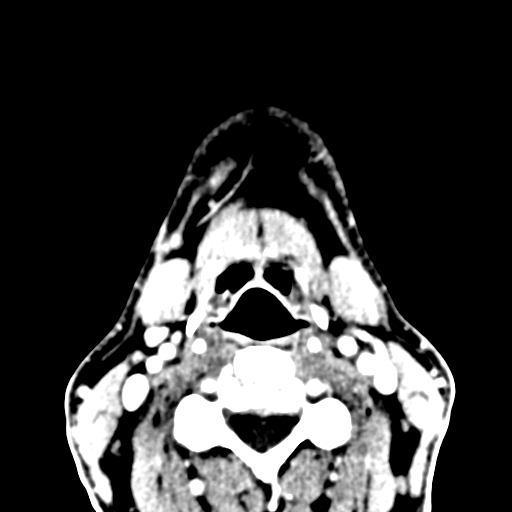
[im 6/86  bone]
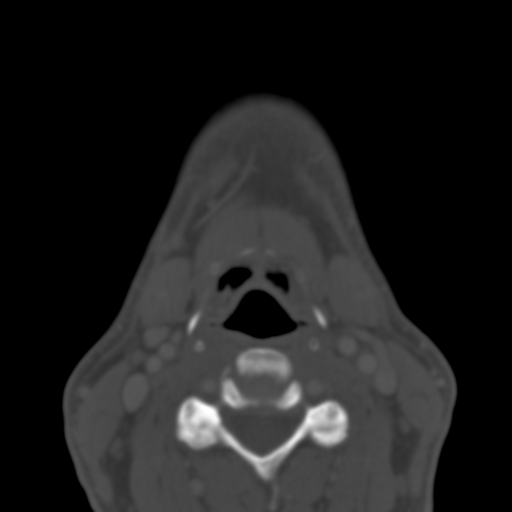
[im 15/86  bone]
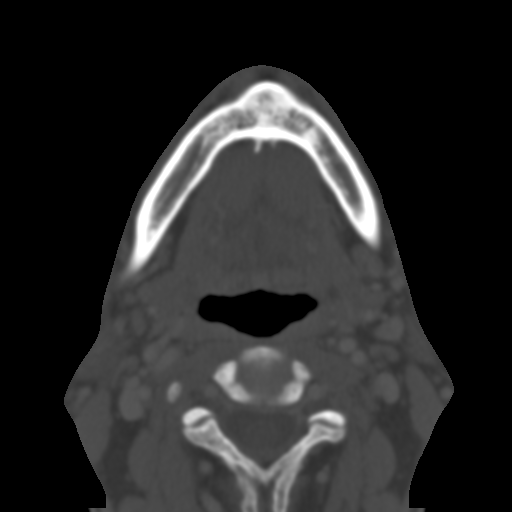
[im 24/86  bone]
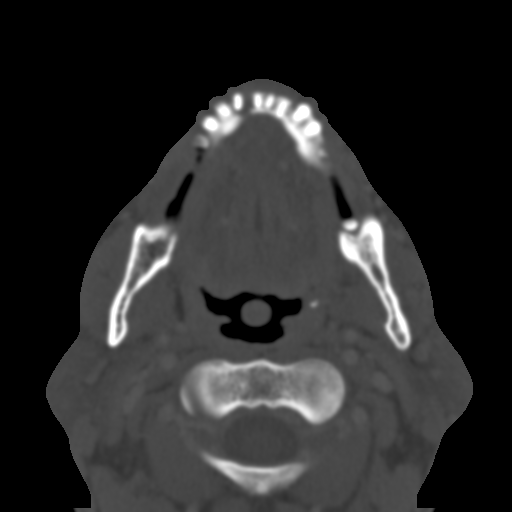
[im 33/86  bone]
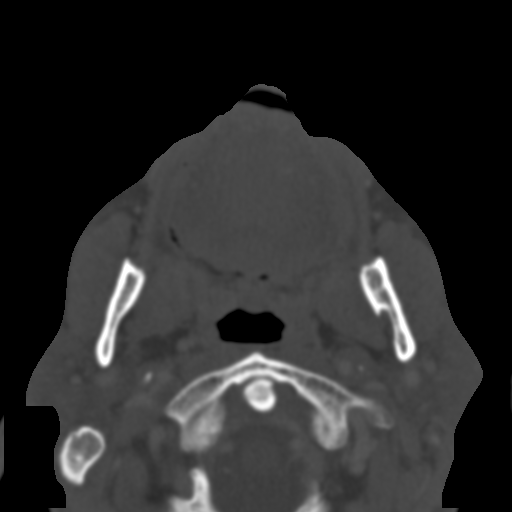
[im 44/86  brain]
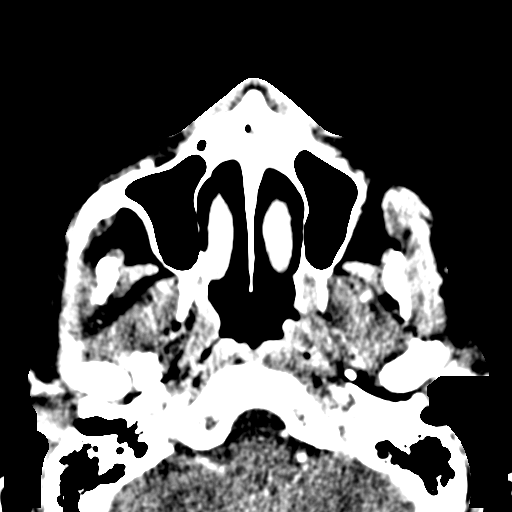
[im 44/86  bone]
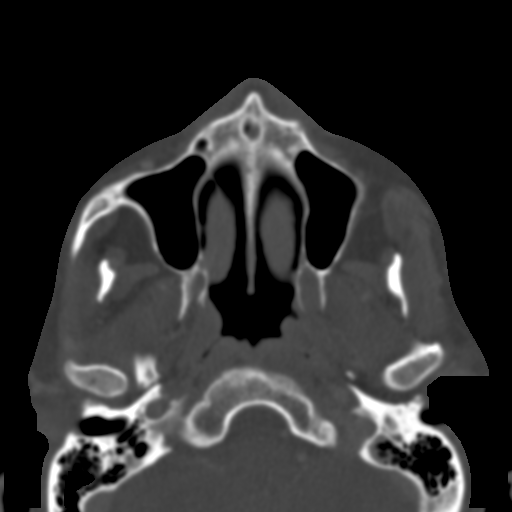
[im 53/86  bone]
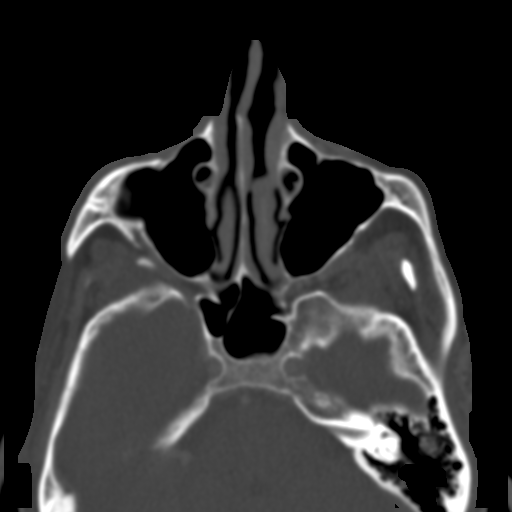
[im 62/86  bone]
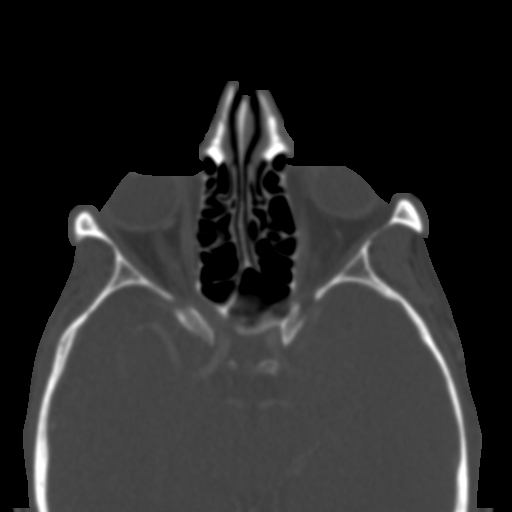
[im 71/86  bone]
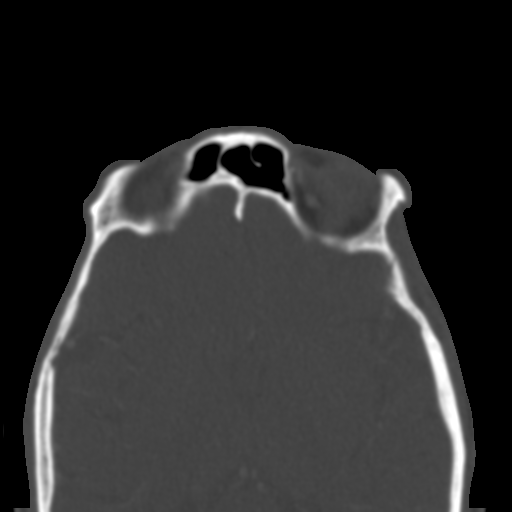
[im 80/86  brain]
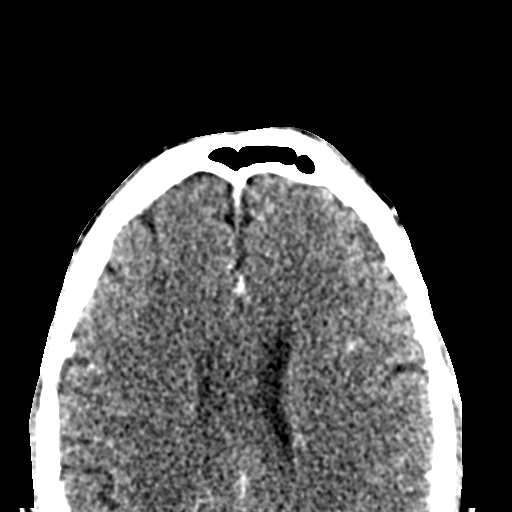
[im 80/86  bone]
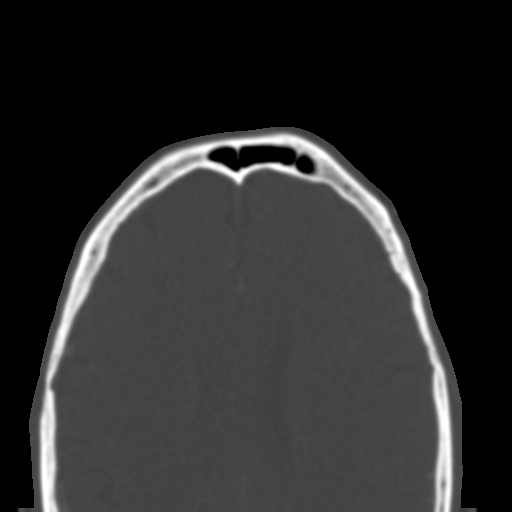

[Series 7: facialbone 2.0 cor st · coronal · 0.36mm/px · 3 of 79 slices shown]
[im 27/79  bone]
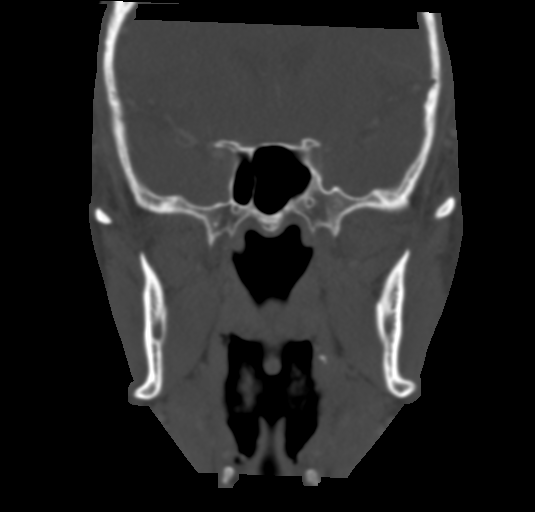
[im 35/79  bone]
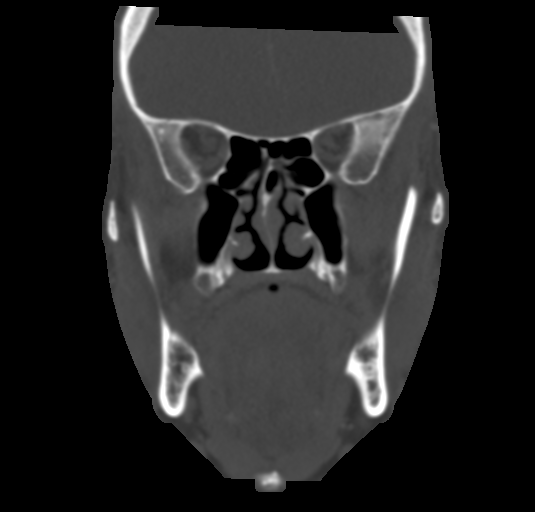
[im 44/79  bone]
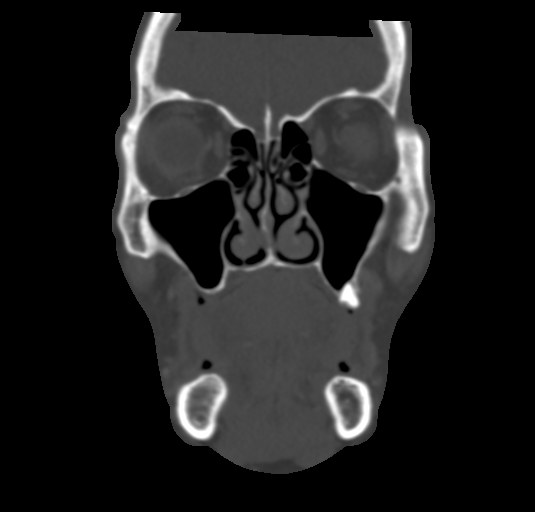

[Series 8: facialbone 2.0 sag st · sagittal · 0.34mm/px · 3 of 83 slices shown]
[im 28/83  bone]
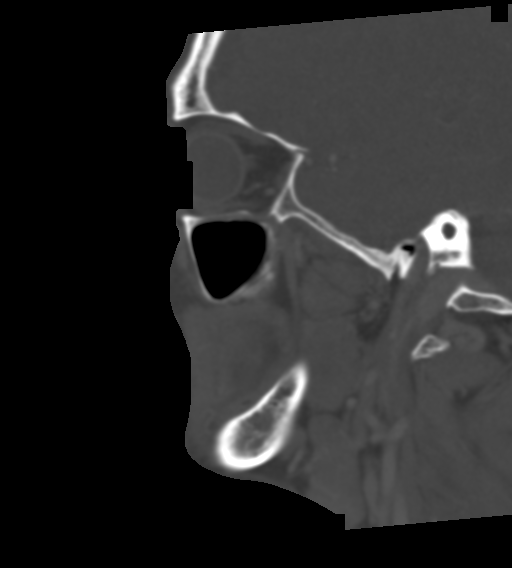
[im 42/83  bone]
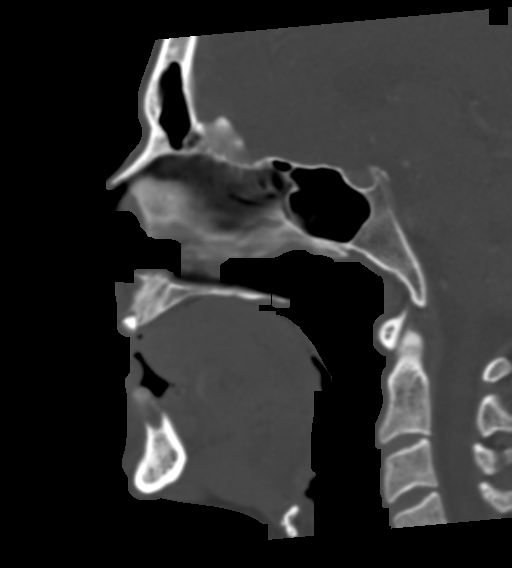
[im 55/83  bone]
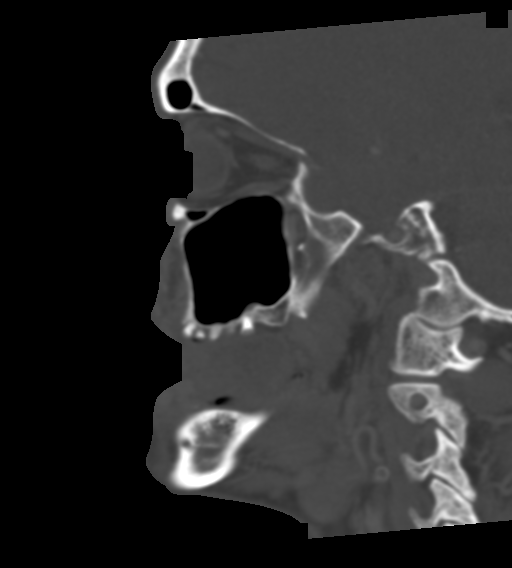

[15 of 47 positions shown; findings below may reference images not displayed]

FINDINGS: Osseous: There are extensive lucencies throughout the mandible and
maxilla, consistent with periodontal disease. Multiple teeth are
missing, possible prior extraction. All of the teeth which remain
are associated with periapical lucencies, some of which involve
anterior maxillary or mandibular cortical destruction. All of the
visible teeth are involved with extensive dental caries, resulting
in loss of the crown. Frank mandibular osteomyelitis is not
established.

Orbits: Negative. No traumatic or inflammatory finding.

Sinuses: Clear.

Soft tissues: No evidence of facial cellulitis or subperiosteal
abscess related to the mandible or maxilla. There is a subcutaneous
radiodensity on the RIGHT, along the body of the mandible, which
could represent a small foreign body or dystrophic calcification.
See axial series 4, image 22.

Limited intracranial: Negative.
IMPRESSION: Multiple teeth are missing, but the teeth which remain are uniformly
involved with dental caries and periapical lucencies. No frank
mandibular osteomyelitis. No visible facial cellulitis.

2 mm subcutaneous radiodensity in the RIGHT face along the body of
the mandible; uncertain significance.

## 2020-06-19 DIAGNOSIS — F321 Major depressive disorder, single episode, moderate: Secondary | ICD-10-CM | POA: Insufficient documentation

## 2021-04-17 ENCOUNTER — Ambulatory Visit: Payer: Medicare Other | Admitting: Sports Medicine

## 2021-05-07 ENCOUNTER — Ambulatory Visit: Payer: Medicare Other | Admitting: Sports Medicine

## 2021-05-15 ENCOUNTER — Other Ambulatory Visit: Payer: Self-pay

## 2021-05-15 ENCOUNTER — Ambulatory Visit (INDEPENDENT_AMBULATORY_CARE_PROVIDER_SITE_OTHER): Payer: Medicare Other | Admitting: Sports Medicine

## 2021-05-15 DIAGNOSIS — B351 Tinea unguium: Secondary | ICD-10-CM | POA: Diagnosis not present

## 2021-05-15 DIAGNOSIS — M79609 Pain in unspecified limb: Secondary | ICD-10-CM | POA: Diagnosis not present

## 2021-05-15 DIAGNOSIS — E119 Type 2 diabetes mellitus without complications: Secondary | ICD-10-CM | POA: Diagnosis not present

## 2021-05-15 MED ORDER — CICLOPIROX 8 % EX SOLN: Freq: Every day | CUTANEOUS | 0 refills | Status: AC
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
## 2021-08-16 ENCOUNTER — Ambulatory Visit: Payer: Medicare Other | Admitting: Sports Medicine

## 2021-12-03 DIAGNOSIS — S81809A Unspecified open wound, unspecified lower leg, initial encounter: Secondary | ICD-10-CM | POA: Insufficient documentation

## 2021-12-03 DIAGNOSIS — F1911 Other psychoactive substance abuse, in remission: Secondary | ICD-10-CM | POA: Insufficient documentation

## 2021-12-03 DIAGNOSIS — F1190 Opioid use, unspecified, uncomplicated: Secondary | ICD-10-CM | POA: Insufficient documentation

## 2022-04-15 LAB — COLOGUARD

## 2022-04-15 LAB — EXTERNAL GENERIC LAB PROCEDURE

## 2023-06-24 DIAGNOSIS — Z789 Other specified health status: Secondary | ICD-10-CM | POA: Insufficient documentation

## 2023-10-07 ENCOUNTER — Ambulatory Visit (INDEPENDENT_AMBULATORY_CARE_PROVIDER_SITE_OTHER): Payer: 59

## 2023-10-07 ENCOUNTER — Ambulatory Visit (INDEPENDENT_AMBULATORY_CARE_PROVIDER_SITE_OTHER): Payer: 59 | Admitting: Podiatry

## 2023-10-07 ENCOUNTER — Ambulatory Visit: Payer: 59

## 2023-10-07 DIAGNOSIS — L97419 Non-pressure chronic ulcer of right heel and midfoot with unspecified severity: Secondary | ICD-10-CM

## 2023-10-07 DIAGNOSIS — E11621 Type 2 diabetes mellitus with foot ulcer: Secondary | ICD-10-CM

## 2023-10-07 DIAGNOSIS — M79671 Pain in right foot: Secondary | ICD-10-CM

## 2023-10-07 DIAGNOSIS — M79672 Pain in left foot: Secondary | ICD-10-CM

## 2023-10-07 DIAGNOSIS — Z89432 Acquired absence of left foot: Secondary | ICD-10-CM
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
## 2023-10-11 ENCOUNTER — Encounter: Payer: Self-pay | Admitting: Podiatry

## 2023-10-11 DIAGNOSIS — Z89432 Acquired absence of left foot: Secondary | ICD-10-CM | POA: Insufficient documentation

## 2023-10-11 DIAGNOSIS — E11621 Type 2 diabetes mellitus with foot ulcer: Secondary | ICD-10-CM | POA: Insufficient documentation

## 2023-10-11 DIAGNOSIS — L97419 Non-pressure chronic ulcer of right heel and midfoot with unspecified severity: Secondary | ICD-10-CM | POA: Insufficient documentation

## 2023-12-01 ENCOUNTER — Other Ambulatory Visit: Payer: Medicare Other

## 2024-07-07 DIAGNOSIS — Z01818 Encounter for other preprocedural examination: Secondary | ICD-10-CM | POA: Diagnosis not present

## 2024-11-21 DIAGNOSIS — R079 Chest pain, unspecified: Secondary | ICD-10-CM

## 2024-11-21 DIAGNOSIS — R9431 Abnormal electrocardiogram [ECG] [EKG]: Secondary | ICD-10-CM
